# Patient Record
Sex: Female | Born: 1985 | Race: White | Hispanic: No | Marital: Married | Smoking: Never smoker
Health system: Southern US, Community
[De-identification: ages and names within clinical notes are randomized; demographics above are authoritative.]

## PROBLEM LIST (undated history)

## (undated) DIAGNOSIS — D649 Anemia, unspecified: Secondary | ICD-10-CM

## (undated) DIAGNOSIS — K589 Irritable bowel syndrome without diarrhea: Secondary | ICD-10-CM

## (undated) DIAGNOSIS — T8859XA Other complications of anesthesia, initial encounter: Secondary | ICD-10-CM

## (undated) DIAGNOSIS — E282 Polycystic ovarian syndrome: Secondary | ICD-10-CM

## (undated) DIAGNOSIS — F329 Major depressive disorder, single episode, unspecified: Secondary | ICD-10-CM

## (undated) DIAGNOSIS — T4145XA Adverse effect of unspecified anesthetic, initial encounter: Secondary | ICD-10-CM

## (undated) DIAGNOSIS — F32A Depression, unspecified: Secondary | ICD-10-CM

## (undated) DIAGNOSIS — E079 Disorder of thyroid, unspecified: Secondary | ICD-10-CM

## (undated) DIAGNOSIS — M797 Fibromyalgia: Secondary | ICD-10-CM

## (undated) DIAGNOSIS — R51 Headache: Secondary | ICD-10-CM

## (undated) HISTORY — DX: Disorder of thyroid, unspecified: E07.9

## (undated) HISTORY — PX: NO PAST SURGERIES: SHX2092

## (undated) HISTORY — PX: WISDOM TOOTH EXTRACTION: SHX21

---
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
## 2004-01-13 ENCOUNTER — Inpatient Hospital Stay (HOSPITAL_COMMUNITY): Admission: AD | Admit: 2004-01-13 | Discharge: 2004-01-13 | Payer: Self-pay | Admitting: Obstetrics and Gynecology

## 2004-03-05 ENCOUNTER — Encounter (INDEPENDENT_AMBULATORY_CARE_PROVIDER_SITE_OTHER): Payer: Self-pay | Admitting: Specialist

## 2004-03-05 ENCOUNTER — Inpatient Hospital Stay (HOSPITAL_COMMUNITY): Admission: AD | Admit: 2004-03-05 | Discharge: 2004-03-07 | Payer: Self-pay | Admitting: Obstetrics and Gynecology

## 2004-04-16 ENCOUNTER — Other Ambulatory Visit: Admission: RE | Admit: 2004-04-16 | Discharge: 2004-04-16 | Payer: Self-pay | Admitting: Obstetrics and Gynecology

## 2005-04-29 ENCOUNTER — Other Ambulatory Visit: Admission: RE | Admit: 2005-04-29 | Discharge: 2005-04-29 | Payer: Self-pay | Admitting: Obstetrics and Gynecology

## 2005-07-30 ENCOUNTER — Other Ambulatory Visit: Admission: RE | Admit: 2005-07-30 | Discharge: 2005-07-30 | Payer: Self-pay | Admitting: Obstetrics and Gynecology

## 2005-11-06 ENCOUNTER — Other Ambulatory Visit: Admission: RE | Admit: 2005-11-06 | Discharge: 2005-11-06 | Payer: Self-pay | Admitting: Obstetrics and Gynecology

## 2006-05-25 ENCOUNTER — Other Ambulatory Visit: Admission: RE | Admit: 2006-05-25 | Discharge: 2006-05-25 | Payer: Self-pay | Admitting: Obstetrics and Gynecology

## 2007-05-31 ENCOUNTER — Other Ambulatory Visit: Admission: RE | Admit: 2007-05-31 | Discharge: 2007-05-31 | Payer: Self-pay | Admitting: Obstetrics and Gynecology

## 2009-02-28 ENCOUNTER — Other Ambulatory Visit: Admission: RE | Admit: 2009-02-28 | Discharge: 2009-02-28 | Payer: Self-pay | Admitting: Family Medicine

## 2010-12-13 NOTE — Discharge Summary (Signed)
NAMEUNDRA, Brittany Cunningham              ACCOUNT NO.:  [id number]   MEDICAL RECORD NO.:  [id number]          PATIENT TYPE:  INP   LOCATION:  9103                          FACILITY:  [facility]   PHYSICIAN:  James A. Ashley Royalty, M.D.DATE OF BIRTH:  01-23-1986   DATE OF ADMISSION:  03/05/2004  DATE OF DISCHARGE:  03/07/2004                                 DISCHARGE SUMMARY   DISCHARGE DIAGNOSES:  1.  Intrauterine pregnancy at term, delivered.  2.  Term birth living child vertex.  3.  Condylomata acuminata--vulva.   CONSULTATIONS:  None.   DISCHARGE MEDICATIONS:  Percocet, Motrin, Chromagen.   HISTORY:  This is an 25 year old primigravida [redacted] weeks gestation.  The  patient was admitted with spontaneous rupture of membranes per Dr.  Sydnee Cabal. She was also having irregular contractions. For the remainder of  the past medical history, please see chart. Initial cervical examination  revealed 2 cm dilatation.  For the remainder of the history and physical  please see chart.   HOSPITAL COURSE:  The patient was admitted to [facility]  [place]. Admission laboratory studies were drawn. Intrauterine pressure  catheter was placed. The patient went on to labor and deliver on March 05, 2004. The infant was a 7 pound 4 ounce female, Apgar's 8 at 1 minute, 9 at 5  minutes and sent to the newborn nursery. At the time of delivery, several  white __________ were noted on the patient's perineum. A punch biopsy was  taken for a representative sample and submitted to pathology for histologic  studies. The pathology returned showing condylomata acuminata.  The  patient's postpartum course was complicated by modest anemia which was  asymptomatic.  Hemoglobin the following day was up slightly and she was felt  to be a candidate for discharge. Hence, she was discharged home March 07, 2004 afebrile and in satisfactory condition.   ACCESSORY CLINICAL FINDINGS:  Hemoglobin and hematocrit on admission were  10.4 and 31.4 respectively.  Repeat hemoglobin March 06, 2004 was 7.6.  Repeat hemoglobin March 07, 2004 was 7.8.   DISPOSITION:  The patient is to return to [facility]  in 4-6 weeks for postpartum evaluation.      JAM/MEDQ  D:  04/24/2004  T:  04/25/2004  Job:  [id number]

## 2011-10-15 ENCOUNTER — Emergency Department
Admission: EM | Admit: 2011-10-15 | Discharge: 2011-10-15 | Disposition: A | Discharge status: Home Health | Payer: BC Managed Care – PPO | Source: Home / Self Care | Attending: Emergency Medicine | Admitting: Emergency Medicine

## 2011-10-15 DIAGNOSIS — J039 Acute tonsillitis, unspecified: Secondary | ICD-10-CM

## 2011-10-15 LAB — POCT RAPID STREP A (OFFICE): Rapid Strep A Screen: NEGATIVE

## 2011-10-15 MED ORDER — CLARITHROMYCIN 500 MG PO TABS: 500.0000 mg | ORAL_TABLET | Freq: Two times a day (BID) | ORAL | Status: AC

## 2011-10-15 NOTE — ED Provider Notes (Signed)
History     CSN: [id number]  Arrival date & time 10/15/11  1911   First MD Initiated Contact with Patient 10/15/11 1923      Chief Complaint  Patient presents with  . Sore Throat    (Consider location/radiation/quality/duration/timing/severity/associated sxs/prior treatment) HPI Brittany Cunningham is a 26 y.o. female who complains of onset of cold symptoms for a few days. Not using any meds.  Moderate.  Constant. ++ sore throat No cough No pleuritic pain No wheezing No nasal congestion No post-nasal drainage No sinus pain/pressure No chest congestion No itchy/red eyes No earache No hemoptysis No SOB No chills/sweats No fever No nausea No vomiting No abdominal pain No diarrhea No skin rashes No fatigue No myalgias No headache    History reviewed. No pertinent past medical history.  History reviewed. No pertinent past surgical history.  History reviewed. No pertinent family history.  History  Substance Use Topics  . Smoking status: Not on file  . Smokeless tobacco: Not on file  . Alcohol Use: No    OB History    Grav Para Term Preterm Abortions TAB SAB Ect Mult Living                  Review of Systems  All other systems reviewed and are negative.    Allergies  Review of patient's allergies indicates no known allergies.  Home Medications   Current Outpatient Rx  Name Route Sig Dispense Refill  . CLARITHROMYCIN 500 MG PO TABS Oral Take 1 tablet (500 mg total) by mouth 2 (two) times daily. 16 tablet 0    BP 132/82  Pulse 101  Temp(Src) 98.2 F (36.8 C) (Oral)  Resp 16  Ht 5\' 3"  (1.6 m)  Wt 172 lb 4 oz (78.132 kg)  BMI 30.51 kg/m2  SpO2 98%  Physical Exam  Nursing note and vitals reviewed. Constitutional: She is oriented to person, place, and time. She appears well-developed and well-nourished.  HENT:  Head: Normocephalic and atraumatic.  Right Ear: Tympanic membrane, external ear and ear canal normal.  Left Ear: Tympanic membrane, external  ear and ear canal normal.  Nose: Nose normal.  Mouth/Throat: Oropharyngeal exudate, posterior oropharyngeal edema (tonsillar enlargement bilateral 1+) and posterior oropharyngeal erythema present. No tonsillar abscesses.  Eyes: No scleral icterus.  Neck: Neck supple.  Cardiovascular: Regular rhythm and normal heart sounds.   Pulmonary/Chest: Effort normal and breath sounds normal. No respiratory distress.  Neurological: She is alert and oriented to person, place, and time.  Skin: Skin is warm and dry.  Psychiatric: She has a normal mood and affect. Her speech is normal.    ED Course  Procedures (including critical care time)  Labs Reviewed - No data to display No results found.   1. Tonsillitis       MDM  1)  Take the prescribed antibiotic as instructed. 2)  Use nasal saline solution (over the counter) at least 3 times a day. 3)  Use over the counter decongestants like Zyrtec-D every 12 hours as needed to help with congestion.  If you have hypertension, do not take medicines with sudafed.  4)  Can take tylenol every 6 hours or motrin every 8 hours for pain or fever. 5)  Follow up with your primary doctor if no improvement in 5-7 days, sooner if increasing pain, fever, or new symptoms.     Marlaine Hind, MD 10/15/11 [phone]

## 2011-10-15 NOTE — ED Notes (Signed)
Pt c/o sore throat onset Sat.  Pt states she has white patches on L side.  Pt states she has felt febrile at times.

## 2011-10-21 ENCOUNTER — Telehealth: Payer: Self-pay | Admitting: *Deleted

## 2011-11-22 ENCOUNTER — Emergency Department
Admission: EM | Admit: 2011-11-22 | Discharge: 2011-11-22 | Disposition: A | Discharge status: Home / Self Care | Payer: BC Managed Care – PPO | Source: Home / Self Care | Attending: Emergency Medicine | Admitting: Emergency Medicine

## 2011-11-22 DIAGNOSIS — J029 Acute pharyngitis, unspecified: Secondary | ICD-10-CM

## 2011-11-22 DIAGNOSIS — J039 Acute tonsillitis, unspecified: Secondary | ICD-10-CM

## 2011-11-22 LAB — POCT URINALYSIS DIP (MANUAL ENTRY)
Bilirubin, UA: NEGATIVE
Blood, UA: NEGATIVE
Glucose, UA: NEGATIVE
Ketones, POC UA: NEGATIVE
Leukocytes, UA: NEGATIVE
Nitrite, UA: POSITIVE
Protein Ur, POC: NEGATIVE
Spec Grav, UA: 1.015 (ref 1.005–1.03)
Urobilinogen, UA: 0.2 (ref 0–1)
pH, UA: 5.5 (ref 5–8)

## 2011-11-22 MED ORDER — CLARITHROMYCIN 500 MG PO TABS: 500.0000 mg | ORAL_TABLET | Freq: Two times a day (BID) | ORAL | Status: AC

## 2011-11-22 NOTE — ED Notes (Signed)
Erroneous U/A ordered and recorded from different patient; erroneous urine culture ordered from different patient.

## 2011-11-22 NOTE — ED Provider Notes (Addendum)
History     CSN: [id number]  Arrival date & time 11/22/11  1710   First MD Initiated Contact with Patient 10/15/11 1923      No chief complaint on file.   (Consider location/radiation/quality/duration/timing/severity/associated sxs/prior treatment) HPI  Brittany Cunningham is a 26 y.o. female who complains of onset of sore throat for 1-2 days. Not using any meds.  Moderate.  Constant.  Had tonsillitis 1 month ago. ++ sore throat No cough No pleuritic pain No wheezing No nasal congestion No post-nasal drainage No sinus pain/pressure No chest congestion No itchy/red eyes No earache No hemoptysis No SOB No chills/sweats No fever No nausea No vomiting No abdominal pain No diarrhea No skin rashes No fatigue No myalgias No headache    No past medical history on file.  No past surgical history on file.  No family history on file.  History  Substance Use Topics  . Smoking status: Not on file  . Smokeless tobacco: Not on file  . Alcohol Use: No    OB History    Grav Para Term Preterm Abortions TAB SAB Ect Mult Living                  Review of Systems  All other systems reviewed and are negative.    Allergies  Review of patient's allergies indicates no known allergies.  Home Medications   Current Outpatient Rx  Name Route Sig Dispense Refill  . CLARITHROMYCIN 500 MG PO TABS Oral Take 1 tablet (500 mg total) by mouth 2 (two) times daily. 18 tablet 0    There were no vitals taken for this visit.  Physical Exam  Nursing note and vitals reviewed. Constitutional: She is oriented to person, place, and time. She appears well-developed and well-nourished.  HENT:  Head: Normocephalic and atraumatic.  Right Ear: Tympanic membrane, external ear and ear canal normal.  Left Ear: Tympanic membrane, external ear and ear canal normal.  Nose: Nose normal.  Mouth/Throat: Oropharyngeal exudate, posterior oropharyngeal edema (tonsillar enlargement bilateral 1+) and posterior  oropharyngeal erythema present. No tonsillar abscesses.  Eyes: No scleral icterus.  Neck: Neck supple.  Cardiovascular: Regular rhythm and normal heart sounds.   Pulmonary/Chest: Effort normal and breath sounds normal. No respiratory distress.  Neurological: She is alert and oriented to person, place, and time.  Skin: Skin is warm and dry.  Psychiatric: She has a normal mood and affect. Her speech is normal.    ED Course  Procedures  (including critical care time)   Labs Reviewed  POCT URINALYSIS DIP (MANUAL ENTRY)  URINE CULTURE   No results found.   1. Acute pharyngitis   2. Tonsillitis       MDM  1)  Take the prescribed antibiotic as instructed.  The rapid strep test is negative.  A throat culture is pending.  However according to her physical examination I feel that antibiotic therapy is appropriate. 2)  Use nasal saline solution (over the counter) at least 3 times a day. 3)  Can take tylenol every 6 hours or motrin every 8 hours for pain or fever. 4)  Follow up with your primary doctor if no improvement in 5-7 days, sooner if increasing pain, fever, or new symptoms.   Followup with ENT if this becomes a recurrent issue.  Make sure she changes her toothbrush and washes anything that may have touched her mouth recently.  Marlaine Hind, MD 11/22/11 1758  Marlaine Hind, MD 11/23/11 [phone]

## 2011-11-23 ENCOUNTER — Encounter: Payer: Self-pay | Admitting: Emergency Medicine

## 2011-11-23 LAB — STREP A DNA PROBE: GASP: NEGATIVE

## 2012-06-02 ENCOUNTER — Emergency Department
Admission: EM | Admit: 2012-06-02 | Discharge: 2012-06-02 | Disposition: A | Discharge status: Home / Self Care | Payer: Self-pay | Source: Home / Self Care

## 2012-06-02 ENCOUNTER — Encounter: Payer: Self-pay | Admitting: *Deleted

## 2012-06-02 DIAGNOSIS — R197 Diarrhea, unspecified: Secondary | ICD-10-CM

## 2012-06-02 DIAGNOSIS — F4310 Post-traumatic stress disorder, unspecified: Secondary | ICD-10-CM

## 2012-06-02 DIAGNOSIS — R109 Unspecified abdominal pain: Secondary | ICD-10-CM

## 2012-06-02 DIAGNOSIS — K5900 Constipation, unspecified: Secondary | ICD-10-CM

## 2012-06-02 DIAGNOSIS — K589 Irritable bowel syndrome without diarrhea: Secondary | ICD-10-CM

## 2012-06-02 DIAGNOSIS — F419 Anxiety disorder, unspecified: Secondary | ICD-10-CM

## 2012-06-02 NOTE — ED Notes (Signed)
Patient c/o episodes of diarrhea and constipation on and off for 1-2 months. States she feels without energy, tiered, and weak. Has tried Pepto and Tylenol.
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
## 2012-06-07 ENCOUNTER — Ambulatory Visit (INDEPENDENT_AMBULATORY_CARE_PROVIDER_SITE_OTHER): Payer: BC Managed Care – PPO | Admitting: Sports Medicine

## 2012-06-07 ENCOUNTER — Encounter: Payer: Self-pay | Admitting: Sports Medicine

## 2012-06-07 VITALS — BP 133/84 | HR 64 | Temp 98.2°F | Resp 12 | Ht 63.0 in | Wt 190.0 lb

## 2012-06-07 DIAGNOSIS — F39 Unspecified mood [affective] disorder: Secondary | ICD-10-CM | POA: Insufficient documentation

## 2012-06-07 DIAGNOSIS — Z299 Encounter for prophylactic measures, unspecified: Secondary | ICD-10-CM | POA: Insufficient documentation

## 2012-06-07 DIAGNOSIS — R519 Headache, unspecified: Secondary | ICD-10-CM | POA: Insufficient documentation

## 2012-06-07 DIAGNOSIS — R51 Headache: Secondary | ICD-10-CM | POA: Insufficient documentation

## 2012-06-07 MED ORDER — MELOXICAM 15 MG PO TABS: ORAL_TABLET | ORAL | Status: DC

## 2012-06-07 MED ORDER — CITALOPRAM HYDROBROMIDE 20 MG PO TABS: 20.0000 mg | ORAL_TABLET | Freq: Every day | ORAL | Status: DC

## 2012-06-07 NOTE — Assessment & Plan Note (Signed)
Mobic. Sounds like tension rather than migraine type headaches
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
## 2012-06-07 NOTE — Assessment & Plan Note (Addendum)
PTSD/depression with anxious features. Citalopram 20. I would certainly like to have her talk to psychiatry downstairs, however unfortunately her insurance does not pay for any further visits, and her co-pay will be too high. She declines my offer to set this up. Return to clinic in 2 weeks to reassess.

## 2012-06-07 NOTE — Assessment & Plan Note (Signed)
Lipids, TSH, T3, T4, CBC, CMET. Return to clinic for cervical cancer screening

## 2012-06-21 ENCOUNTER — Ambulatory Visit: Payer: BC Managed Care – PPO | Admitting: Sports Medicine

## 2012-06-21 ENCOUNTER — Telehealth: Payer: Self-pay | Admitting: *Deleted

## 2012-06-21 DIAGNOSIS — Z0289 Encounter for other administrative examinations: Secondary | ICD-10-CM

## 2012-06-21 NOTE — Telephone Encounter (Signed)
Tried to contact pt and had to [facility] in regards pt missed her appt this am to f/u. LM for pt to call back to reschedule and to let us know how she is doing.

## 2012-09-04 ENCOUNTER — Encounter: Payer: Self-pay | Admitting: *Deleted

## 2012-09-04 ENCOUNTER — Emergency Department (INDEPENDENT_AMBULATORY_CARE_PROVIDER_SITE_OTHER)
Admission: EM | Admit: 2012-09-04 | Discharge: 2012-09-04 | Disposition: A | Discharge status: Home / Self Care | Payer: BC Managed Care – PPO | Source: Home / Self Care | Attending: Family Medicine | Admitting: Family Medicine

## 2012-09-04 DIAGNOSIS — R509 Fever, unspecified: Secondary | ICD-10-CM

## 2012-09-04 DIAGNOSIS — J111 Influenza due to unidentified influenza virus with other respiratory manifestations: Secondary | ICD-10-CM

## 2012-09-04 DIAGNOSIS — R05 Cough: Secondary | ICD-10-CM

## 2012-09-04 DIAGNOSIS — R059 Cough, unspecified: Secondary | ICD-10-CM

## 2012-09-04 LAB — POCT INFLUENZA A/B
Influenza A, POC: NEGATIVE
Influenza B, POC: NEGATIVE

## 2012-09-04 LAB — POCT RAPID STREP A (OFFICE): Rapid Strep A Screen: NEGATIVE

## 2012-09-04 MED ORDER — OSELTAMIVIR PHOSPHATE 75 MG PO CAPS: 75.0000 mg | ORAL_CAPSULE | Freq: Two times a day (BID) | ORAL | Status: DC

## 2012-09-04 NOTE — ED Notes (Signed)
Pt c/o chills, fever, headache since yesterday. Has tried OTC Tylenol, Ibuprofen.

## 2012-09-04 NOTE — ED Provider Notes (Signed)
History     CSN: [id number]  Arrival date & time 09/04/12  1737   First MD Initiated Contact with Patient 09/04/12 1756      Chief Complaint  Patient presents with  . Fever  . Chills  . Headache       HPI Comments: Patient complains of rather sudden onset of flu-like symptoms yesterday with chills, fever, headache, mild sore throat, fatigue, and mild non-productive cough.  The history is provided by the patient.    Past Medical History  Diagnosis Date  . Thyroid disease     currently not treated with meds    History reviewed. No pertinent past surgical history.  Family History  Problem Relation Age of Onset  . Thyroid disease Mother   . Hypertension Father   . Diabetes Maternal Grandmother     History  Substance Use Topics  . Smoking status: Never Smoker   . Smokeless tobacco: Not on file  . Alcohol Use: No    OB History   Grav Para Term Preterm Abortions TAB SAB Ect Mult Living                  Review of Systems + sore throat + cough No pleuritic pain No wheezing No nasal congestion ? post-nasal drainage No sinus pain/pressure No itchy/red eyes No earache No hemoptysis No SOB + fever, + chills No nausea No vomiting No abdominal pain No diarrhea No urinary symptoms No skin rashes + fatigue + myalgias + headache Used OTC meds without relief  Allergies  Review of patient's allergies indicates no known allergies.  Home Medications   Current Outpatient Rx  Name  Route  Sig  Dispense  Refill  . citalopram (CELEXA) 20 MG tablet   Oral   Take 1 tablet (20 mg total) by mouth daily.   90 tablet   0   . JUNEL 1/20 1-20 MG-MCG tablet               . meloxicam (MOBIC) 15 MG tablet      One tab PO qAM with breakfast for 2 weeks, then daily prn pain.   30 tablet   3   . oseltamivir (TAMIFLU) 75 MG capsule   Oral   Take 1 capsule (75 mg total) by mouth every 12 (twelve) hours.   10 capsule   0     BP 143/92  Pulse 119   Temp(Src) 99 F (37.2 C) (Oral)  Resp 16  Ht 5\' 3"  (1.6 m)  Wt 198 lb 8 oz (90.039 kg)  BMI 35.17 kg/m2  SpO2 98%  Physical Exam Nursing notes and Vital Signs reviewed. Appearance:  Patient appears stated age, and in no acute distress.  Patient is obese (BMI 35.2) Eyes:  Pupils are equal, round, and reactive to light and accomodation.  Extraocular movement is intact.  Conjunctivae are not inflamed  Ears:  Canals normal.  Tympanic membranes normal.  Nose:  Mildly congested turbinates.  No sinus tenderness.   Pharynx:  Normal Neck:  Supple.  Slightly tender shotty posterior nodes are palpated bilaterally  Lungs:  Clear to auscultation.  Breath sounds are equal.  Heart:  Regular rate and rhythm without murmurs, rubs, or gallops.  Abdomen:  Nontender without masses or hepatosplenomegaly.  Bowel sounds are present.  No CVA or flank tenderness.  Extremities:  No edema.  No calf tenderness Skin:  No rash present.   ED Course  Procedures  none  Labs Reviewed  POCT INFLUENZA  A/B negative  POCT RAPID STREP A (OFFICE) negative      1. Influenza-like illness       MDM  Despite negative flu test, will begin Tamiflu. Take Mucinex D (guaifenesin with decongestant) twice daily for congestion.  Increase fluid intake, rest. May use Afrin nasal spray (or generic oxymetazoline) twice daily for about 5 days.  Also recommend using saline nasal spray several times daily and saline nasal irrigation (AYR is a common brand) Stop all antihistamines for now, and other non-prescription cough/cold preparations. May take Ibuprofen 200mg , 4 tabs every 8 hours with food for fever, sore throat, body aches, etc. May take Delsym Cough Suppressant at bedtime for nighttime cough.  Follow-up with family doctor if not improving about one week.        Lattie Haw, MD 09/09/12 [phone]

## 2012-11-03 LAB — OB RESULTS CONSOLE HIV ANTIBODY (ROUTINE TESTING): HIV: NONREACTIVE

## 2012-11-03 LAB — OB RESULTS CONSOLE GC/CHLAMYDIA
Chlamydia: NEGATIVE
Gonorrhea: NEGATIVE

## 2012-11-03 LAB — OB RESULTS CONSOLE RUBELLA ANTIBODY, IGM: Rubella: IMMUNE

## 2012-11-03 LAB — OB RESULTS CONSOLE RPR: RPR: NONREACTIVE

## 2012-11-03 LAB — OB RESULTS CONSOLE HEPATITIS B SURFACE ANTIGEN: Hepatitis B Surface Ag: NEGATIVE

## 2012-12-23 ENCOUNTER — Other Ambulatory Visit (HOSPITAL_COMMUNITY): Payer: Self-pay | Admitting: Obstetrics and Gynecology

## 2012-12-23 DIAGNOSIS — Z3689 Encounter for other specified antenatal screening: Secondary | ICD-10-CM

## 2012-12-23 DIAGNOSIS — IMO0001 Reserved for inherently not codable concepts without codable children: Secondary | ICD-10-CM

## 2012-12-28 ENCOUNTER — Ambulatory Visit (HOSPITAL_COMMUNITY): Payer: BC Managed Care – PPO

## 2013-02-17 ENCOUNTER — Other Ambulatory Visit: Payer: Self-pay

## 2013-02-22 ENCOUNTER — Other Ambulatory Visit (HOSPITAL_COMMUNITY): Payer: Self-pay | Admitting: Obstetrics and Gynecology

## 2013-02-22 DIAGNOSIS — O30009 Twin pregnancy, unspecified number of placenta and unspecified number of amniotic sacs, unspecified trimester: Secondary | ICD-10-CM

## 2013-02-25 ENCOUNTER — Ambulatory Visit (HOSPITAL_COMMUNITY)
Admission: RE | Admit: 2013-02-25 | Discharge: 2013-02-25 | Disposition: A | Discharge status: Home / Self Care | Payer: BC Managed Care – PPO | Source: Ambulatory Visit | Attending: Obstetrics and Gynecology | Admitting: Obstetrics and Gynecology

## 2013-02-25 ENCOUNTER — Encounter (HOSPITAL_COMMUNITY): Payer: Self-pay

## 2013-02-25 ENCOUNTER — Other Ambulatory Visit (HOSPITAL_COMMUNITY): Payer: Self-pay | Admitting: Obstetrics and Gynecology

## 2013-02-25 VITALS — BP 141/79 | HR 110 | Wt 222.5 lb

## 2013-02-25 DIAGNOSIS — O365922 Maternal care for other known or suspected poor fetal growth, second trimester, fetus 2: Secondary | ICD-10-CM

## 2013-02-25 DIAGNOSIS — O30009 Twin pregnancy, unspecified number of placenta and unspecified number of amniotic sacs, unspecified trimester: Secondary | ICD-10-CM | POA: Insufficient documentation

## 2013-02-25 DIAGNOSIS — Z1389 Encounter for screening for other disorder: Secondary | ICD-10-CM | POA: Insufficient documentation

## 2013-02-25 DIAGNOSIS — Z8751 Personal history of pre-term labor: Secondary | ICD-10-CM | POA: Insufficient documentation

## 2013-02-25 DIAGNOSIS — O358XX0 Maternal care for other (suspected) fetal abnormality and damage, not applicable or unspecified: Secondary | ICD-10-CM | POA: Insufficient documentation

## 2013-02-25 DIAGNOSIS — Z363 Encounter for antenatal screening for malformations: Secondary | ICD-10-CM | POA: Insufficient documentation

## 2013-02-25 IMAGING — US US OB DETAIL+14 WK
1 series · 13 of 28 positions shown · non-contrast
Comparison: none

[Series 1: us ob detail+14 wk · 0.26mm/px · 13 of 130 slices shown]
[im 5/130]
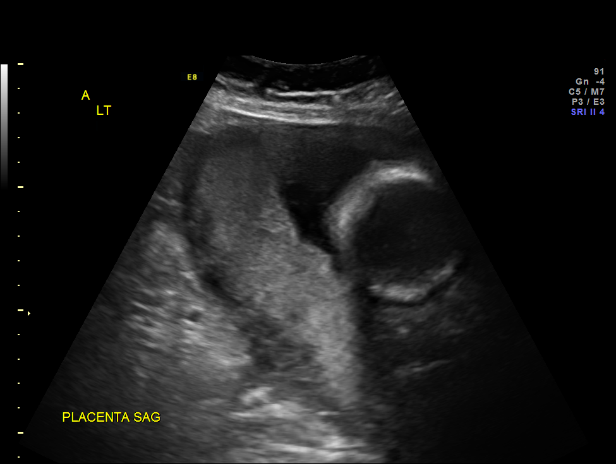
[im 15/130]
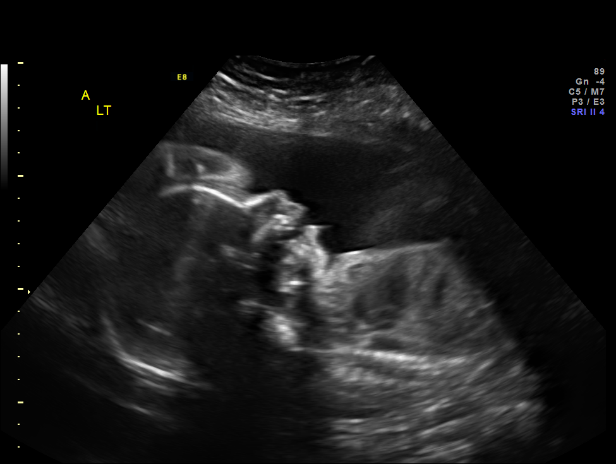
[im 24/130]
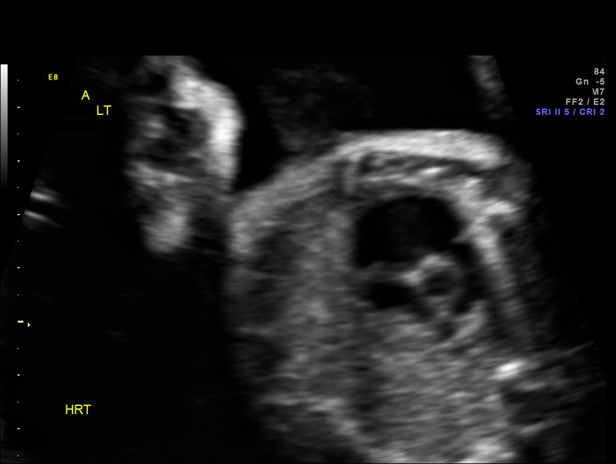
[im 34/130]
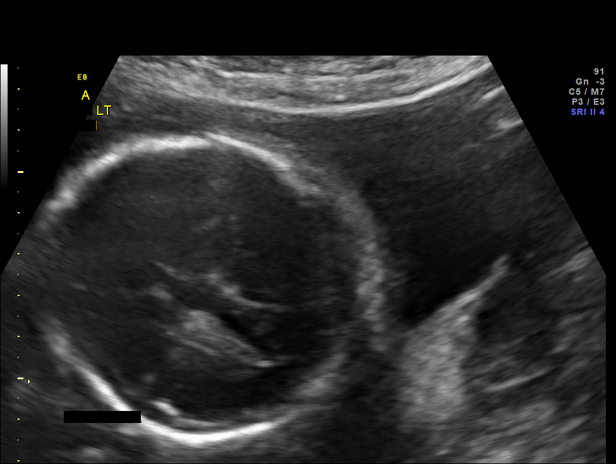
[im 44/130]
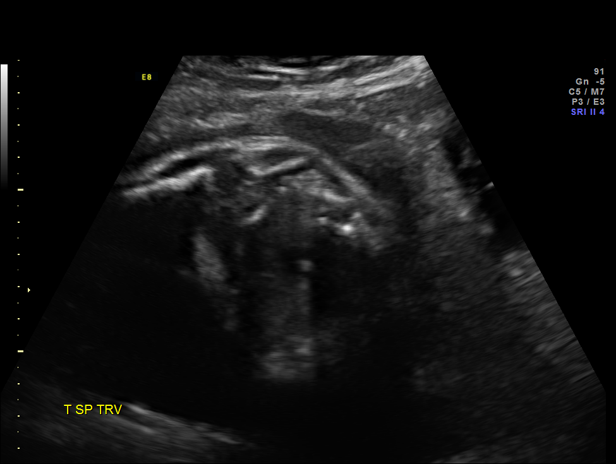
[im 53/130]
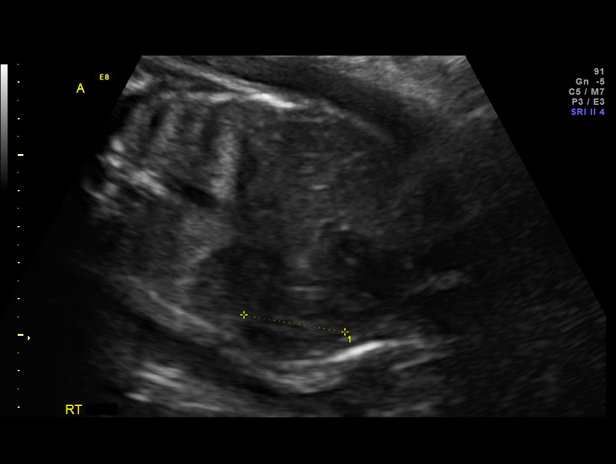
[im 67/130]
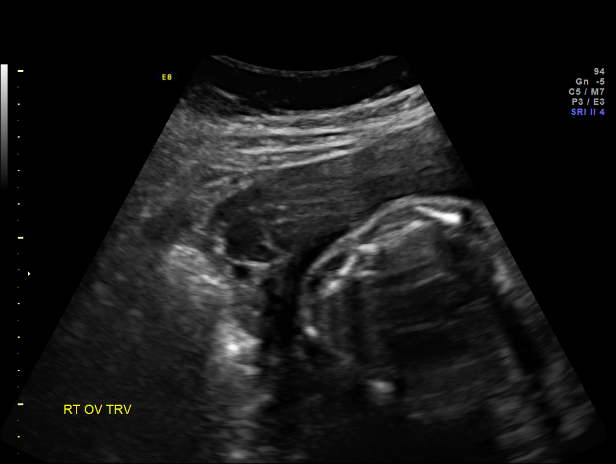
[im 77/130]
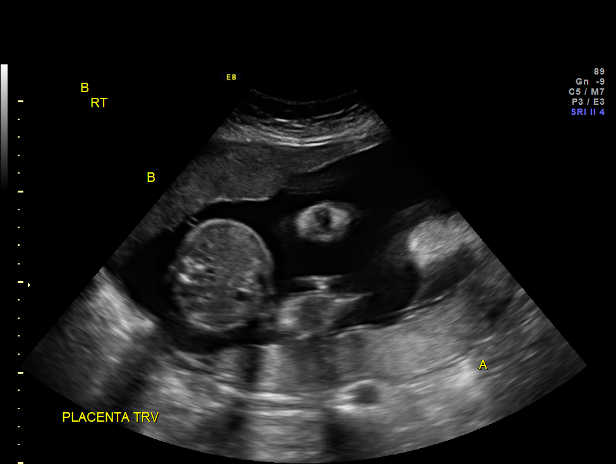
[im 87/130]
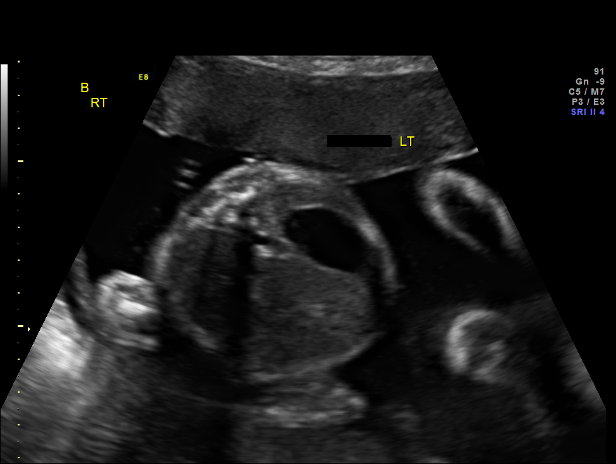
[im 96/130]
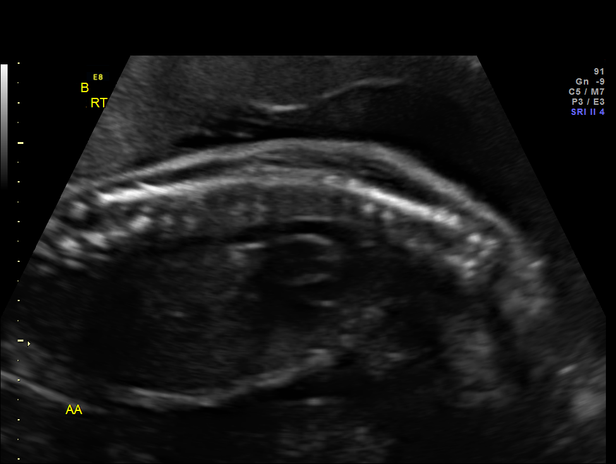
[im 106/130]
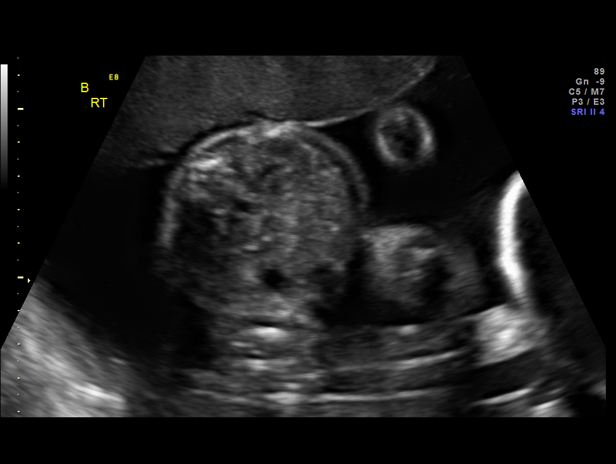
[im 115/130]
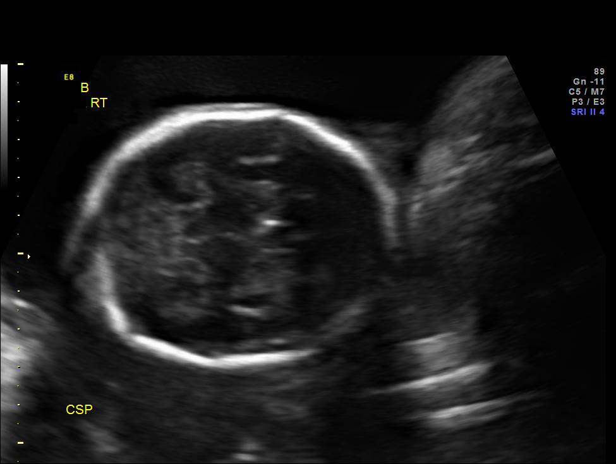
[im 125/130]
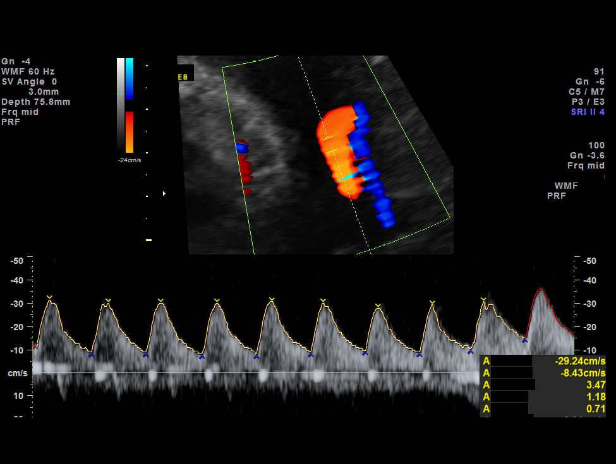

[13 of 28 positions shown; findings below may reference images not displayed]

OBSTETRICS REPORT
                      (Signed Final 02/25/2013 [DATE])

Service(s) Provided

 US OB DETAIL + 14 WK                                  76811.0
 US OB DETAIL ADDL GEST + 14 WK                        76811.1
 US UA CORD DOPPLER                                    76820.0
 US UA ADDL GEST                                       76820.1
Indications

 Detailed fetal anatomic survey
 Di-Di Twin pregnancy, twins discordant, antepartum
 Poor obstetric history: Previous preterm delivery
 (36 weeks)
Fetal Evaluation (Fetus A)

 Num Of Fetuses:    2
 Fetal Heart Rate:  167                          bpm
 Cardiac Activity:  Observed
 Fetal Lie:         Lower left Fetus
 Presentation:      Breech
 Placenta:          Posterior, above cervical
                    os
 P. Cord            Visualized, central
 Insertion:

 Membrane Desc:     Dividing
                    Membrane seen
                    - Dichorionic.

 Amniotic Fluid
 AFI FV:      Subjectively within normal limits
                                             Larg Pckt:     5.7  cm
Biometry (Fetus A)

 BPD:     68.2  mm     G. Age:  27w 3d                CI:         76.2   70 - 86
 OFD:     89.5  mm                                    FL/HC:      20.4   18.6 -

 HC:     252.6  mm     G. Age:  27w 3d       31  %    HC/AC:      1.07   1.05 -

 AC:     235.8  mm     G. Age:  27w 6d       65  %    FL/BPD:     75.7   71 - 87
 FL:      51.6  mm     G. Age:  27w 4d       48  %    FL/AC:      21.9   20 - 24
 HUM:     45.9  mm     G. Age:  27w 0d       46  %
 CER:     30.6  mm     G. Age:  26w 6d       44  %

 Est. FW:    5555  gm      2 lb 7 oz     65  %     FW Discordancy     0 \ 22 %
Gestational Age (Fetus A)

 LMP:           27w 1d        Date:  08/19/12                 EDD:   05/26/13
 U/S Today:     27w 4d                                        EDD:   05/23/13
 Best:          27w 1d     Det. By:  LMP  (08/19/12)          EDD:   05/26/13
Anatomy (Fetus A)

 Cranium:          Appears normal         Aortic Arch:      Not well visualized
 Fetal Cavum:      Appears normal         Ductal Arch:      Appears normal
 Ventricles:       Appears normal         Diaphragm:        Appears normal
 Choroid Plexus:   Appears normal         Stomach:          Appears normal, left
                                                            sided
 Cerebellum:       Appears normal         Abdomen:          Appears normal
 Posterior Fossa:  Appears normal         Abdominal Wall:   Not well visualized
 Nuchal Fold:      Not applicable (>20    Cord Vessels:     Appears normal (3
                   wks GA)                                  vessel cord)
 Face:             Appears normal         Kidneys:          Appear normal
                   (orbits and profile)
 Lips:             Appears normal         Bladder:          Appears normal
 Heart:            Appears normal         Spine:            Limited views
                   (4CH, axis, and                          appear normal
                   situs)
 RVOT:             Appears normal         Lower             Visualized
                                          Extremities:
 LVOT:             Appears normal         Upper             Visualized
                                          Extremities:

 Other:  Fetus appears to be a male. Technically difficult due to advanced GA
         and fetal position.
Targeted Anatomy (Fetus A)

 Fetal Central Nervous System
 Cisterna Magna:
Doppler - Fetal Vessels (Fetus A)

 Umbilical Artery
 S/D:   2.92           40  %tile       RI:
 PI:    1.03                           PSV:       38.57   cm/s
 Umbilical Artery
 Absent DFV:    No     Reverse DFV:    No

Fetal Evaluation (Fetus B)

 Num Of Fetuses:    2
 Fetal Heart Rate:  150                          bpm
 Cardiac Activity:  Observed
 Fetal Lie:         Upper right Fetus
 Presentation:      Cephalic
 Placenta:          Anterior right, above
                    cervical os
 P. Cord            Visualized, central
 Insertion:

 Membrane Desc:     Dividing
                    Membrane seen
                    - Dichorionic.
 Amniotic Fluid
 AFI FV:      Subjectively increased
                                             Larg Pckt:    11.3  cm
Biometry (Fetus B)

 BPD:     64.3  mm     G. Age:  26w 0d                CI:         76.5   70 - 86
 OFD:     84.1  mm                                    FL/HC:      21.3   18.6 -

 HC:     240.3  mm     G. Age:  26w 1d        5  %    HC/AC:      1.19   1.05 -

 AC:     202.5  mm     G. Age:  24w 6d      < 3  %    FL/BPD:     79.5   71 - 87
 FL:      51.1  mm     G. Age:  27w 2d       42  %    FL/AC:      25.2   20 - 24
 HUM:     45.5  mm     G. Age:  26w 6d       41  %
 CER:       30  mm     G. Age:  26w 4d       37  %

 Est. FW:     873  gm    1 lb 15 oz      27  %     FW Discordancy        22  %
Gestational Age (Fetus B)

 LMP:           27w 1d        Date:  08/19/12                 EDD:   05/26/13
 U/S Today:     26w 1d                                        EDD:   06/02/13
 Best:          27w 1d     Det. By:  LMP  (08/19/12)          EDD:   05/26/13
Anatomy (Fetus B)

 Cranium:          Appears normal         Aortic Arch:      Appears normal
 Fetal Cavum:      Appears normal         Ductal Arch:      Appears normal
 Ventricles:       Appears normal         Diaphragm:        Appears normal
 Choroid Plexus:   Appears normal         Stomach:          Appears normal, left
                                                            sided
 Cerebellum:       Appears normal         Abdomen:          Appears normal
 Posterior Fossa:  Appears normal         Abdominal Wall:   Appears nml (cord
                                                            insert, abd wall)
 Nuchal Fold:      Not applicable (>20    Cord Vessels:     Appears normal (3
                   wks GA)                                  vessel cord)
 Face:             Orbits appear          Kidneys:          Appear normal
                   normal
 Lips:             Not well visualized    Bladder:          Appears normal
 Heart:            Not well visualized    Spine:            Appears normal
 RVOT:             Not well visualized    Lower             Visualized
                                          Extremities:
 LVOT:             Not well visualized    Upper             Visualized
                                          Extremities:

 Other:  Fetus appears to be a female. Technically difficult due to advanced
         GA and fetal position.
Targeted Anatomy (Fetus B)

 Fetal Central Nervous System
 Cisterna Magna:
Doppler - Fetal Vessels (Fetus B)

 Umbilical Artery
 S/D:   2.77           32  %tile       RI:
 PI:    0.98                           PSV:       38.47   cm/s
 Umbilical Artery
 Absent DFV:    No     Reverse DFV:    No
Cervix Uterus Adnexa

 Cervical Length:    3        cm

 Cervix:       Normal appearance by transabdominal scan.
 Left Ovary:    Within normal limits.
 Right Ovary:   Within normal limits.

 Adnexa:     No abnormality visualized.
Impression

 DC/DA twin gestation
 A 22% inter-twin growth discordance is noted

 A: Breech, male, posterior placenta
     Normal anatomic fetal survey
     Fetal growth is appropriate (65th %tile)
     Normal amniotic fluid volume
     Normal UA Dopplers for Gestational age

 B: Cephalic, maternal right, female, anterior placenta
      Estimated fetal weight at the 27th %tile.  The AC
 measures < 3rd %tile (asymmetric growth)
      The fetal heart and face were not well visualized due to
 fetal position
      Mild polyhydramnios noted (max verticle pocket 11 cm)
      The fetal stomach appears somewhat promintent, but
 within normal limits.  There is no evidence of dilated bowel or
 obvious anatomic abnormalities to explain polyhydramnios
      Normal UA Dopplers for gestational age

 The findings and limitations of the study were discussed with
 the patient.  We briefly reviewed the differential diagnosis for
 polyhydramnios.
Recommendations

 Recommend weekly BPPs and UA Dopplers due to lagging
 AC in Twin B
 Follow up growth scan in 3 weeks

## 2013-02-25 NOTE — Progress Notes (Signed)
Brittany Cunningham  was seen today for an ultrasound appointment.  See full report in [facility].  Impression: DC/DA twin gestation A 22% inter-twin growth discordance is noted   A: Breech, female, posterior placenta     Normal anatomic fetal survey     Fetal growth is appropriate (65th %tile)     Normal amniotic fluid volume     Normal UA Dopplers for Gestational age  B: Cephalic, maternal right, female, anterior placenta      Estimated fetal weight at the 27th %tile.  [facility] measures < 3rd %tile (asymmetric growth)      The fetal heart and face were not well visualized due to fetal position      Mild polyhydramnios noted (max verticle pocket 11 cm)      The fetal stomach appears somewhat promintent, but within normal limits.  There is no evidence of dilated bowel or obvious anatomic abnormalities to explain polyhydramnios      Normal UA Dopplers for gestational age  The findings and limitations of the study were discussed with the patient.  We briefly reviewed the differential diagnosis for polyhydramnios.  Recommendations: Recommend weekly BPPs and UA Dopplers due to lagging [facility] in Twin B Follow up growth scan in 3 weeks  Alpha Gula, MD

## 2013-03-01 ENCOUNTER — Other Ambulatory Visit (HOSPITAL_COMMUNITY): Payer: Self-pay | Admitting: Maternal and Fetal Medicine

## 2013-03-01 DIAGNOSIS — O365922 Maternal care for other known or suspected poor fetal growth, second trimester, fetus 2: Secondary | ICD-10-CM

## 2013-03-01 DIAGNOSIS — O30009 Twin pregnancy, unspecified number of placenta and unspecified number of amniotic sacs, unspecified trimester: Secondary | ICD-10-CM

## 2013-03-02 ENCOUNTER — Ambulatory Visit (HOSPITAL_COMMUNITY)
Admission: RE | Admit: 2013-03-02 | Discharge: 2013-03-02 | Disposition: A | Discharge status: Home / Self Care | Payer: BC Managed Care – PPO | Source: Ambulatory Visit | Attending: Obstetrics and Gynecology | Admitting: Obstetrics and Gynecology

## 2013-03-02 ENCOUNTER — Encounter (HOSPITAL_COMMUNITY): Payer: Self-pay

## 2013-03-02 DIAGNOSIS — Z8751 Personal history of pre-term labor: Secondary | ICD-10-CM | POA: Insufficient documentation

## 2013-03-02 DIAGNOSIS — O365990 Maternal care for other known or suspected poor fetal growth, unspecified trimester, not applicable or unspecified: Secondary | ICD-10-CM | POA: Insufficient documentation

## 2013-03-02 DIAGNOSIS — O409XX0 Polyhydramnios, unspecified trimester, not applicable or unspecified: Secondary | ICD-10-CM | POA: Insufficient documentation

## 2013-03-02 DIAGNOSIS — O365922 Maternal care for other known or suspected poor fetal growth, second trimester, fetus 2: Secondary | ICD-10-CM

## 2013-03-02 DIAGNOSIS — O30009 Twin pregnancy, unspecified number of placenta and unspecified number of amniotic sacs, unspecified trimester: Secondary | ICD-10-CM | POA: Insufficient documentation

## 2013-03-02 IMAGING — US US FETAL BPP W/O NONSTRESS
1 series · 12 of 28 positions shown · non-contrast
Comparison: none

[Series 1: us fetal bpp w/o nonstress · 12 of 30 slices shown]
[im 2/30]
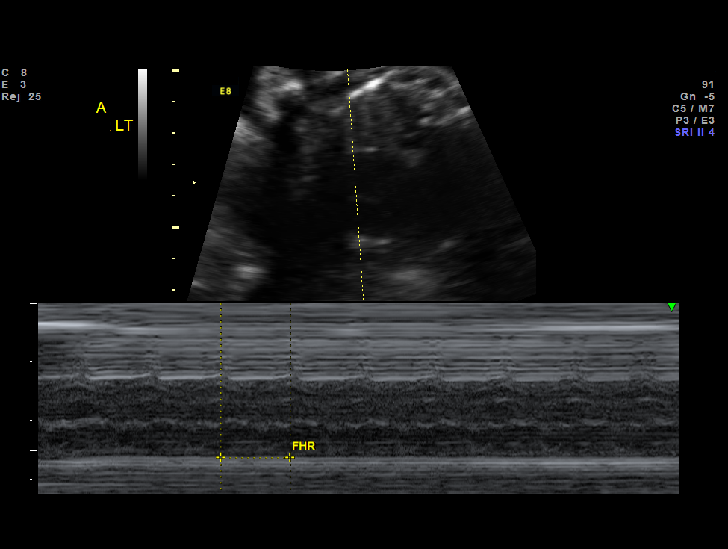
[im 4/30]
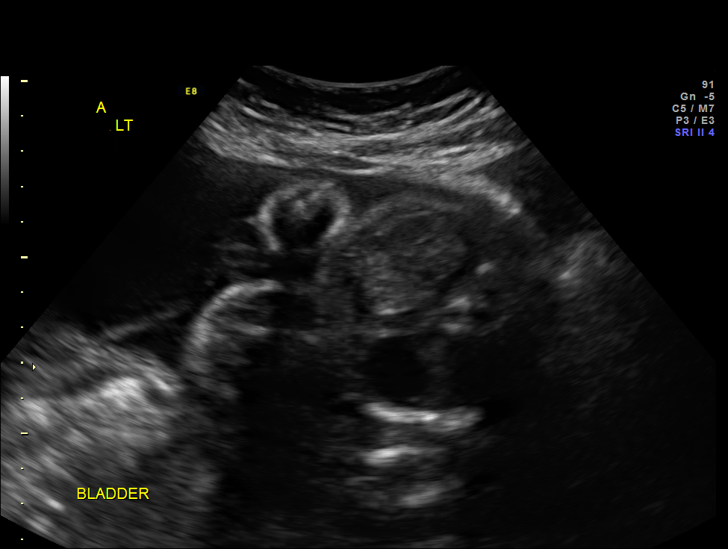
[im 6/30]
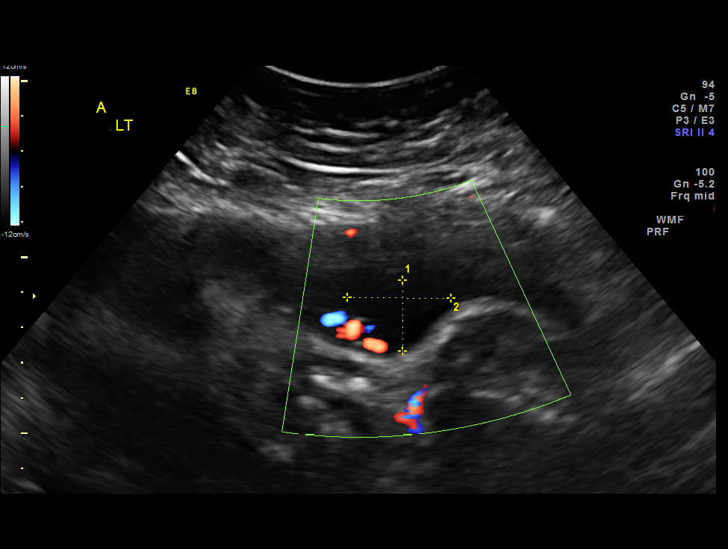
[im 9/30]
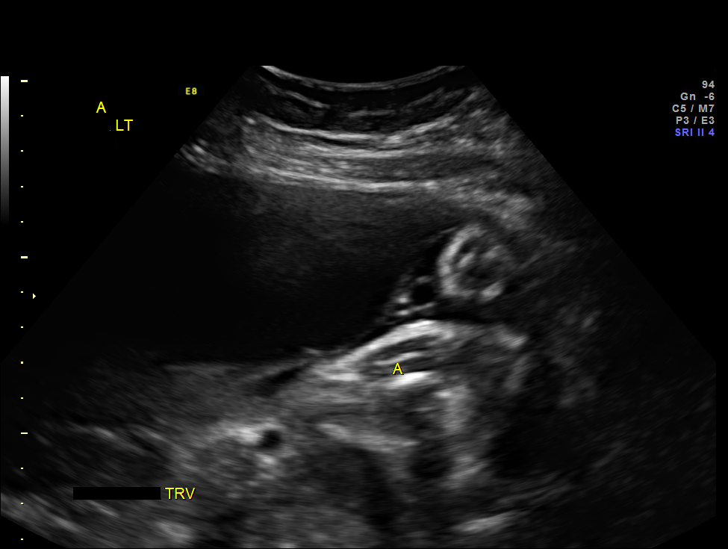
[im 11/30]
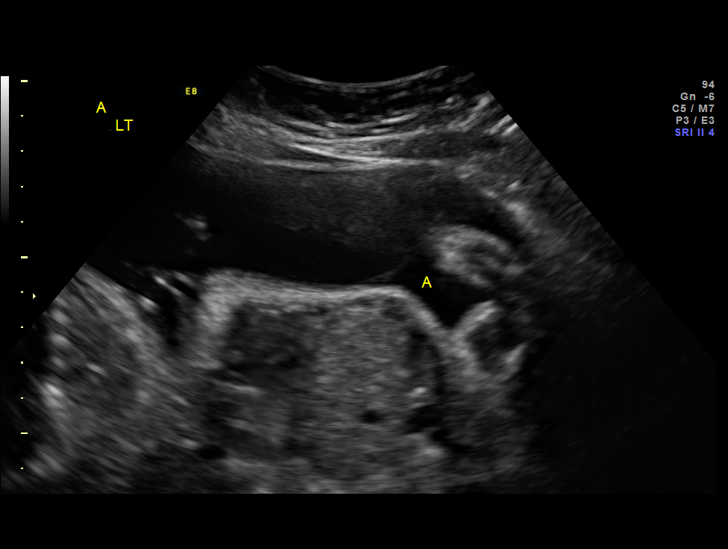
[im 13/30]
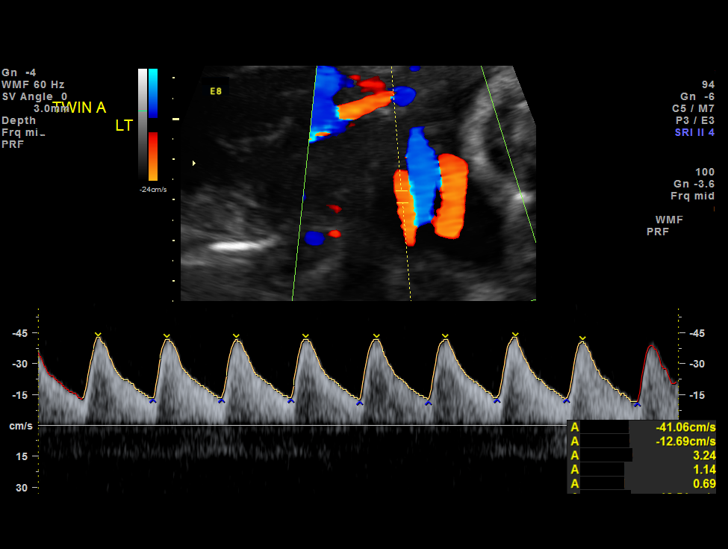
[im 17/30]
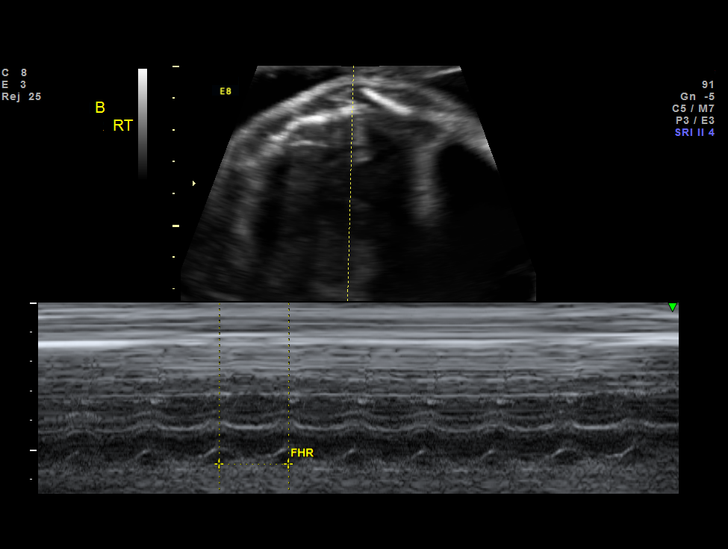
[im 19/30]
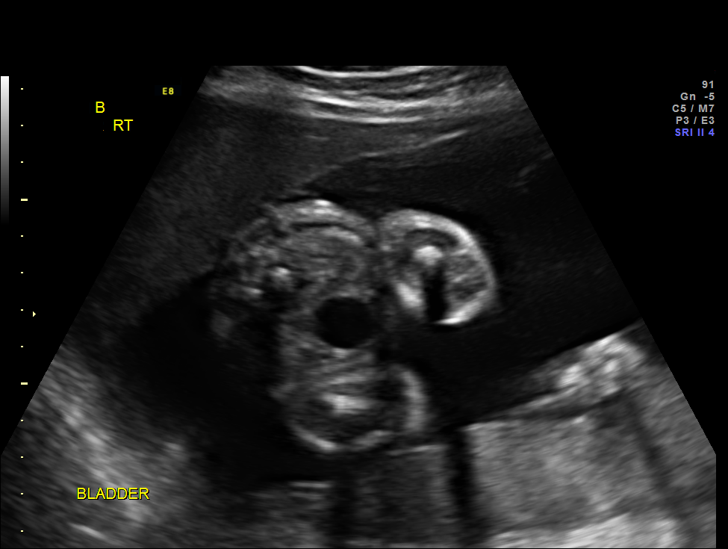
[im 21/30]
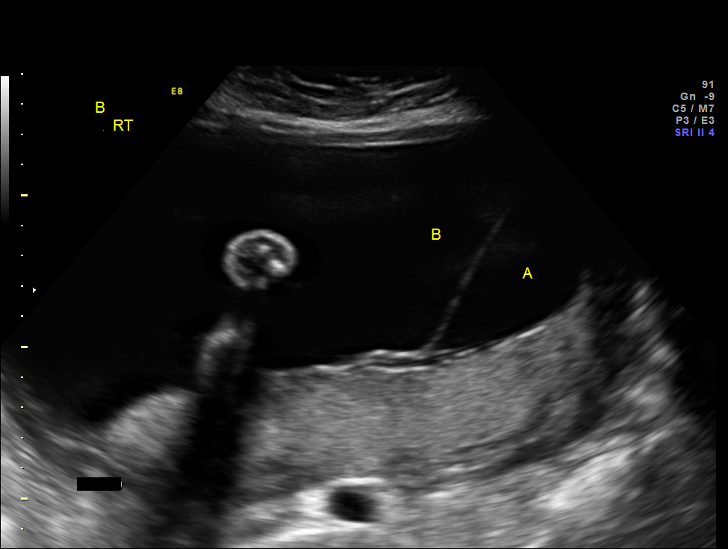
[im 24/30]
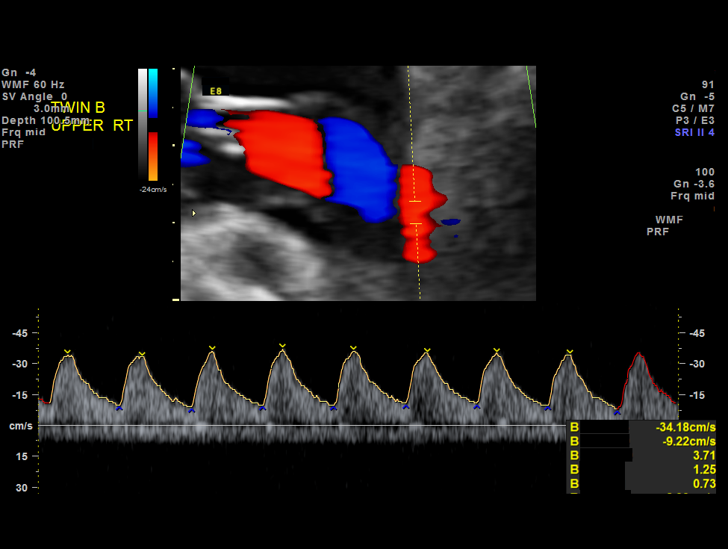
[im 26/30]
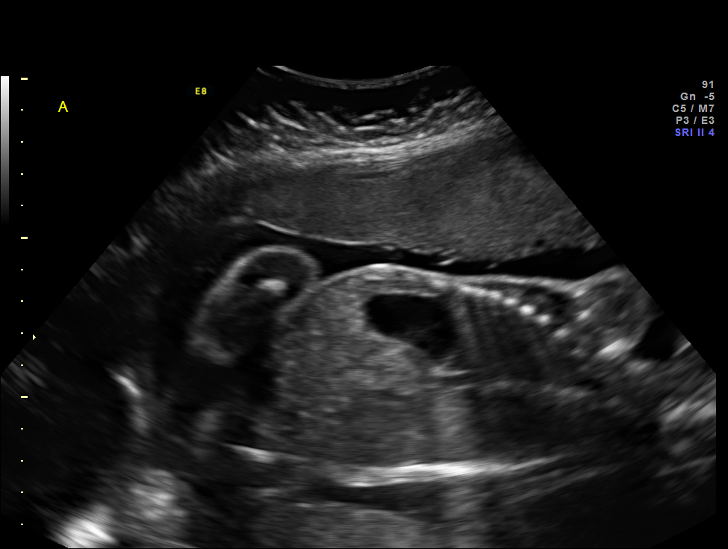
[im 28/30]
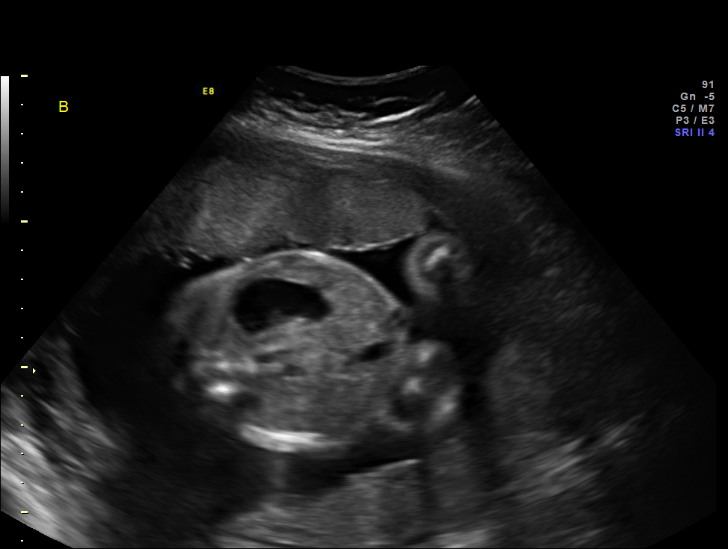

[12 of 28 positions shown; findings below may reference images not displayed]

OBSTETRICS REPORT
                      (Signed Final 03/02/2013 [DATE])

Service(s) Provided

 US UA CORD DOPPLER                                    76820.0
 US UA ADDL GEST                                       76820.1
Indications

 Di-Di Twin pregnancy, twins discordant, antepartum
 Size less than dates (Small for gestational [AGE]
 Twin B (AC<3%)
 Polyhydramnios - Twin B
 Poor obstetric history: Previous preterm delivery
 (36 weeks)
Fetal Evaluation (Fetus A)

 Num Of Fetuses:    2
 Fetal Heart Rate:  145                          bpm
 Cardiac Activity:  Observed
 Fetal Lie:         Lower left Fetus
 Presentation:      Breech
 Placenta:          Posterior, above cervical
                    os
 P. Cord            Previously Visualized
 Insertion:

 Membrane Desc:     Dividing
                    Membrane seen
                    - Dichorionic.

 Amniotic Fluid
 AFI FV:      Subjectively within normal limits
                                             Larg Pckt:     3.9  cm
Biophysical Evaluation (Fetus A)

 Amniotic F.V:   Pocket => 2 cm two         F. Tone:        Observed
                 planes
 F. Movement:    Observed                   Score:          [DATE]
 F. Breathing:   Observed
Gestational Age (Fetus A)
 LMP:           27w 6d        Date:  08/19/12                 EDD:   05/26/13
 Best:          27w 6d     Det. By:  LMP  (08/19/12)          EDD:   05/26/13
Doppler - Fetal Vessels (Fetus A)

 Umbilical Artery
 S/D:   3.14           54  %tile       RI:
 PI:    1.13                           PSV:       48.81   cm/s
 Umbilical Artery
 Absent DFV:    No     Reverse DFV:    No

Fetal Evaluation (Fetus B)

 Num Of Fetuses:    2
 Fetal Heart Rate:  145                          bpm
 Cardiac Activity:  Observed
 Fetal Lie:         Upper right Fetus
 Presentation:      Cephalic
 Placenta:          Anterior right, above
                    cervical os
 P. Cord            Previously Visualized
 Insertion:

 Membrane Desc:     Dividing
                    Membrane seen
                    - Dichorionic.

 Amniotic Fluid
 AFI FV:      Subjectively increased
                                             Larg Pckt:     9.5  cm
Biophysical Evaluation (Fetus B)

 Amniotic F.V:   Pocket => 2 cm two         F. Tone:        Observed
                 planes
 F. Movement:    Observed                   Score:          [DATE]
 F. Breathing:   Observed
Gestational Age (Fetus B)

 LMP:           27w 6d        Date:  08/19/12                 EDD:   05/26/13
 Best:          27w 6d     Det. By:  LMP  (08/19/12)          EDD:   05/26/13
Doppler - Fetal Vessels (Fetus B)

 Umbilical Artery
 S/D:   3.17           55  %tile       RI:
 PI:    1.1                            PSV:       38.39   cm/s
 Umbilical Artery
 Absent DFV:    No     Reverse DFV:    No

Impression

 DC/DA twins at 276/7 weeks
 Follow up due to lagging growth and polyhydramnios of Twin
 B
 BPP [DATE] x 2
 Normal UA Doppler studies for gestational age with both twins
 Mild polyhydramnios (Twin B).  Normal amnotic fluid (Twin A)
Recommendations

 Continue weekly BPPs / UA Dopplers.
 Follow up growth in 2 weeks.

## 2013-03-02 NOTE — Progress Notes (Signed)
Alzina Bobak  was seen today for an ultrasound appointment.  See full report in [facility].  Impression: DC/DA twins at 276/7 weeks Follow up due to lagging growth and polyhydramnios of Twin B BPP 8/8 x 2 Normal UA Doppler studies for gestational age with both twins Mild polyhydramnios (Twin B).  Normal amnotic fluid (Twin A)  Recommendations: Continue weekly BPPs / UA Dopplers. Follow up growth in 2 weeks.  Alpha Gula, MD

## 2013-03-09 ENCOUNTER — Inpatient Hospital Stay (HOSPITAL_COMMUNITY)
Admission: AD | Admit: 2013-03-09 | Discharge: 2013-03-09 | Disposition: A | Discharge status: Home / Self Care | Payer: BC Managed Care – PPO | Source: Ambulatory Visit | Attending: Obstetrics and Gynecology | Admitting: Obstetrics and Gynecology

## 2013-03-09 ENCOUNTER — Encounter (HOSPITAL_COMMUNITY): Payer: Self-pay | Admitting: *Deleted

## 2013-03-09 ENCOUNTER — Ambulatory Visit (HOSPITAL_COMMUNITY)
Admit: 2013-03-09 | Discharge: 2013-03-09 | Disposition: A | Discharge status: Home / Self Care | Payer: BC Managed Care – PPO

## 2013-03-09 ENCOUNTER — Other Ambulatory Visit (HOSPITAL_COMMUNITY): Payer: Self-pay | Admitting: Maternal and Fetal Medicine

## 2013-03-09 ENCOUNTER — Ambulatory Visit (HOSPITAL_COMMUNITY)
Admission: RE | Admit: 2013-03-09 | Discharge: 2013-03-09 | Disposition: A | Discharge status: Home / Self Care | Payer: BC Managed Care – PPO | Source: Ambulatory Visit | Attending: Obstetrics and Gynecology | Admitting: Obstetrics and Gynecology

## 2013-03-09 ENCOUNTER — Other Ambulatory Visit: Payer: Self-pay | Admitting: Obstetrics and Gynecology

## 2013-03-09 DIAGNOSIS — O409XX0 Polyhydramnios, unspecified trimester, not applicable or unspecified: Secondary | ICD-10-CM | POA: Insufficient documentation

## 2013-03-09 DIAGNOSIS — Z8751 Personal history of pre-term labor: Secondary | ICD-10-CM | POA: Insufficient documentation

## 2013-03-09 DIAGNOSIS — O365922 Maternal care for other known or suspected poor fetal growth, second trimester, fetus 2: Secondary | ICD-10-CM

## 2013-03-09 DIAGNOSIS — O30009 Twin pregnancy, unspecified number of placenta and unspecified number of amniotic sacs, unspecified trimester: Secondary | ICD-10-CM | POA: Insufficient documentation

## 2013-03-09 DIAGNOSIS — O3090 Multiple gestation, unspecified, unspecified trimester: Secondary | ICD-10-CM | POA: Insufficient documentation

## 2013-03-09 DIAGNOSIS — O365990 Maternal care for other known or suspected poor fetal growth, unspecified trimester, not applicable or unspecified: Secondary | ICD-10-CM | POA: Insufficient documentation

## 2013-03-09 DIAGNOSIS — O26873 Cervical shortening, third trimester: Secondary | ICD-10-CM

## 2013-03-09 DIAGNOSIS — O403XX2 Polyhydramnios, third trimester, fetus 2: Secondary | ICD-10-CM

## 2013-03-09 DIAGNOSIS — O30049 Twin pregnancy, dichorionic/diamniotic, unspecified trimester: Secondary | ICD-10-CM

## 2013-03-09 DIAGNOSIS — O26879 Cervical shortening, unspecified trimester: Secondary | ICD-10-CM

## 2013-03-09 IMAGING — US US FETAL BPP W/O NONSTRESS
1 series · 16 of 28 positions shown · non-contrast
Comparison: none

OBSTETRICS REPORT
                      (Signed Final 03/09/2013 [DATE])

Service(s) Provided
 US UA CORD DOPPLER                                    76820.0
 US UA ADDL GEST                                       76820.1
 US OB TRANSVAGINAL                                    76817.0
Indications
 Di-Di Twin pregnancy, twins discordant, antepartum
 Size less than dates (Small for gestational [AGE]
 Twin B (AC<3%)
 Polyhydramnios - Twin B
 Poor obstetric history: Previous preterm delivery
 (36 weeks)
Fetal Evaluation (Fetus A)
 Num Of Fetuses:    2
 Fetal Heart Rate:  131                          bpm
 Cardiac Activity:  Observed
 Fetal Lie:         Maternal left side
 Presentation:      Breech
 Placenta:          Posterior, above cervical
                    os
 Amniotic Fluid
 AFI FV:      Subjectively within normal limits
                                             Larg Pckt:     4.5  cm
Biophysical Evaluation (Fetus A)
 Amniotic F.V:   Pocket => 2 cm two         F. Tone:        Observed
                 planes
 F. Movement:    Observed                   Score:          [DATE]
 F. Breathing:   Observed
Gestational Age (Fetus A)
 LMP:           28w 6d        Date:  08/19/12                 EDD:   05/26/13
 Best:          28w 6d     Det. By:  LMP  (08/19/12)          EDD:   05/26/13
Doppler - Fetal Vessels (Fetus A)
 Umbilical Artery
 S/D:   3.6            77  %tile       RI:
 PI:    1.04                           PSV:       41.63   cm/s
Fetal Evaluation (Fetus B)
 Fetal Heart Rate:  157                          bpm
 Fetal Lie:         Maternal right side
 Presentation:      Cephalic
 Placenta:          Anterior, above cervical os
 Membrane Desc:     Dividing
                    Membrane seen
 AFI FV:      Polyhydramnios
                                             Larg Pckt:    10.4  cm
Biophysical Evaluation (Fetus B)
 Amniotic F.V:   Polyhydramnios             F. Tone:        Observed
Gestational Age (Fetus B)
Doppler - Fetal Vessels (Fetus B)
 S/D:   2.49           25  %tile       RI:
 PI:    0.92                           PSV:       41.96   cm/s
Cervix Uterus Adnexa
 Cervical Length:    1        cm       Funnel Width:   1.9       cm
 Funnel Length:      1.4      cm
 Cervix:       Appears funnelled, see comments.
Impression
INDICATION: 26 yr old [id number] at 33w8d with
 dichorionic/diamniotic twin gestation; previous preterm
 delivery, and twin B with lagging abdominal circumference for
 BPPs and Doppler studies.

[Series 1: us fetal bpp w/o nonstress · 0.26mm/px · 16 of 29 slices shown]
[im 1/29]
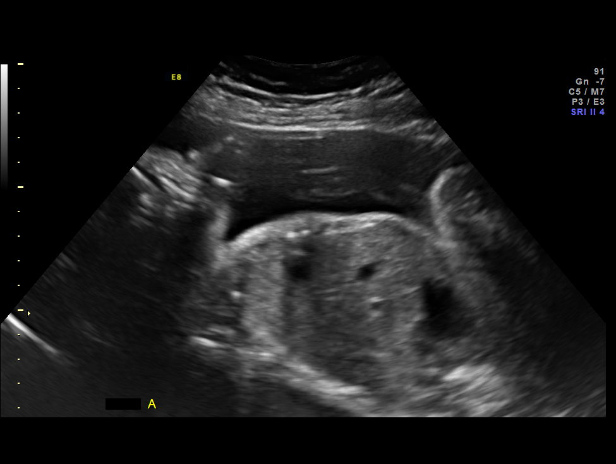
[im 3/29]
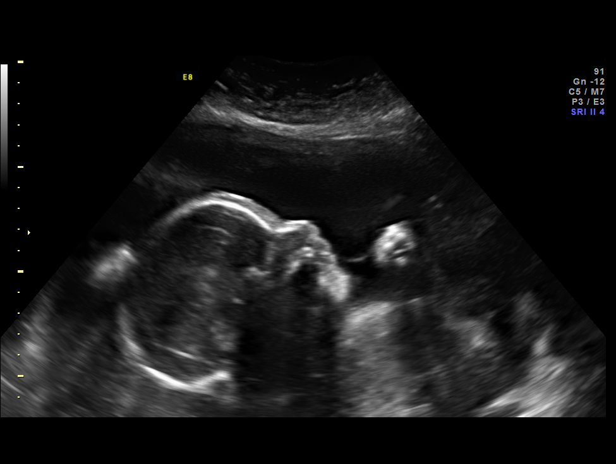
[im 5/29]
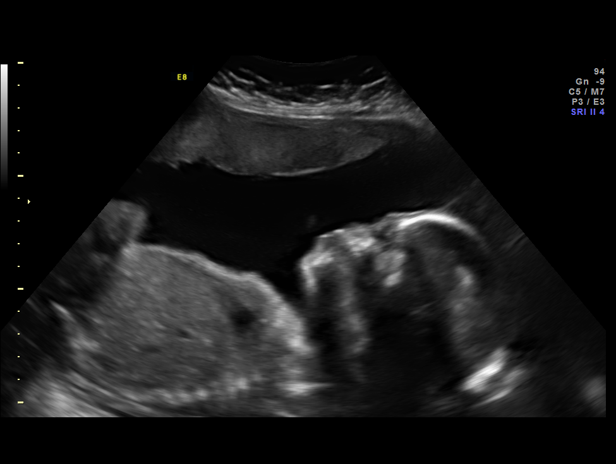
[im 7/29]
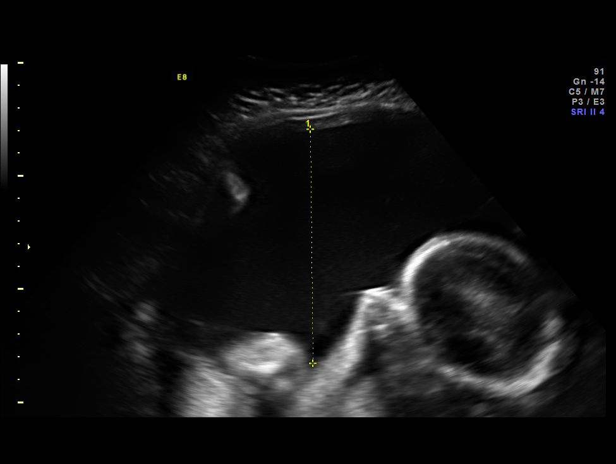
[im 8/29]
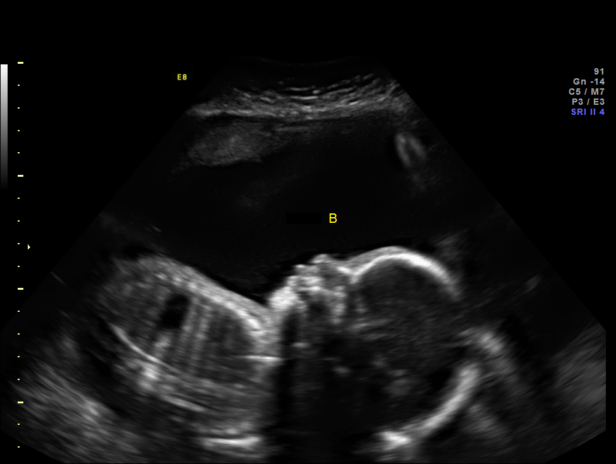
[im 10/29]
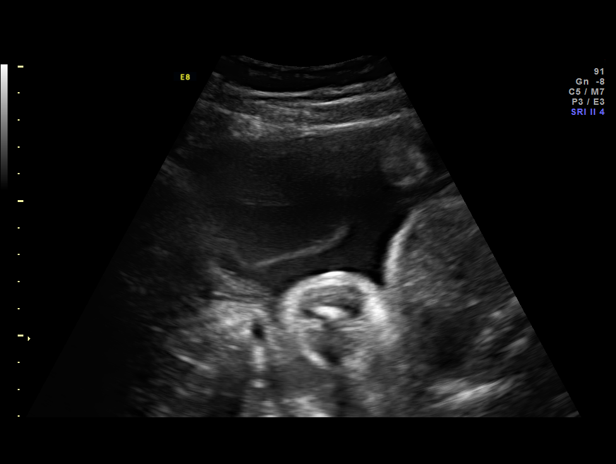
[im 12/29]
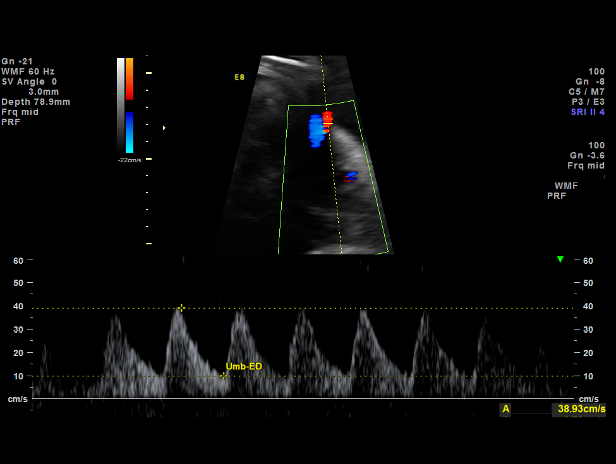
[im 14/29]
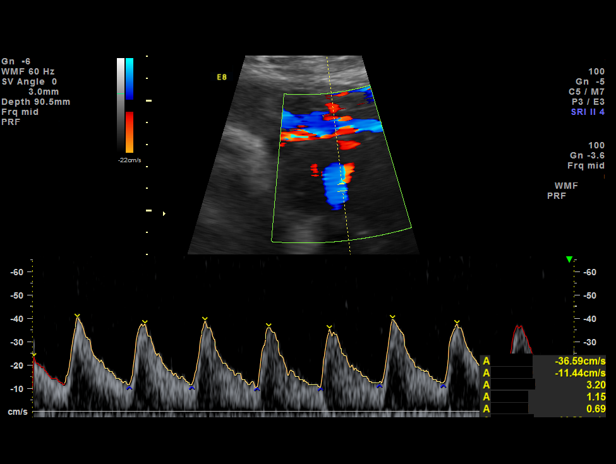
[im 15/29]
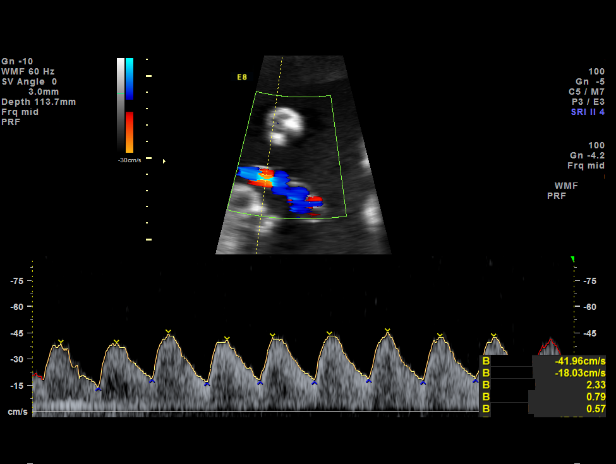
[im 17/29]
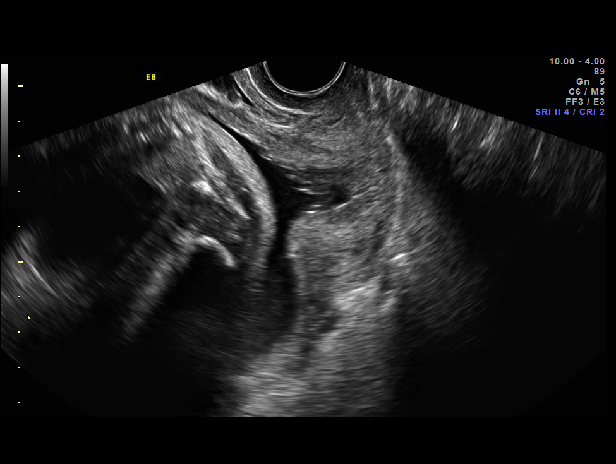
[im 19/29]
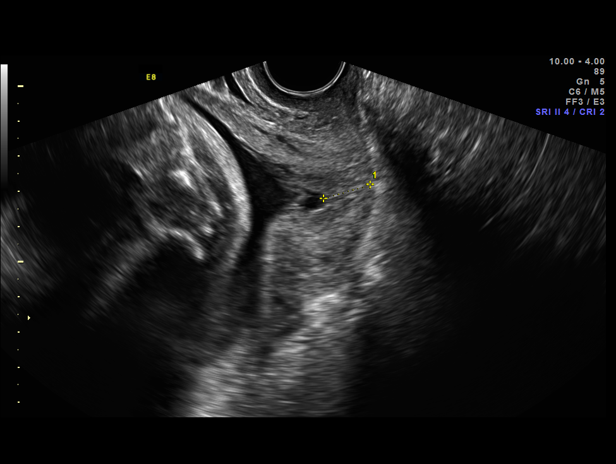
[im 21/29]
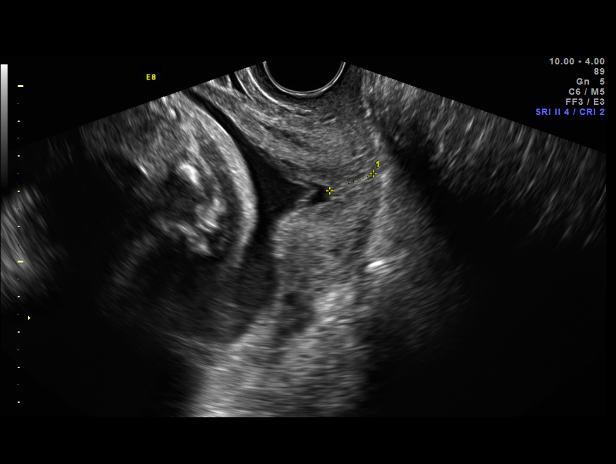
[im 22/29]
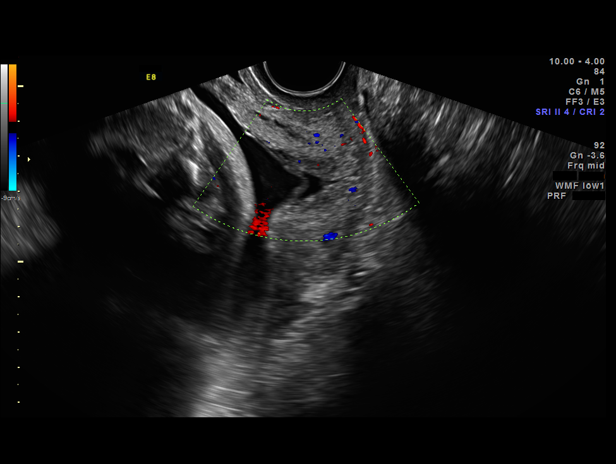
[im 24/29]
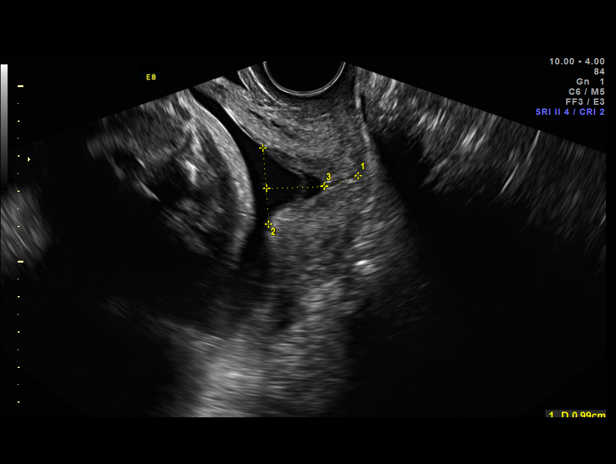
[im 26/29]
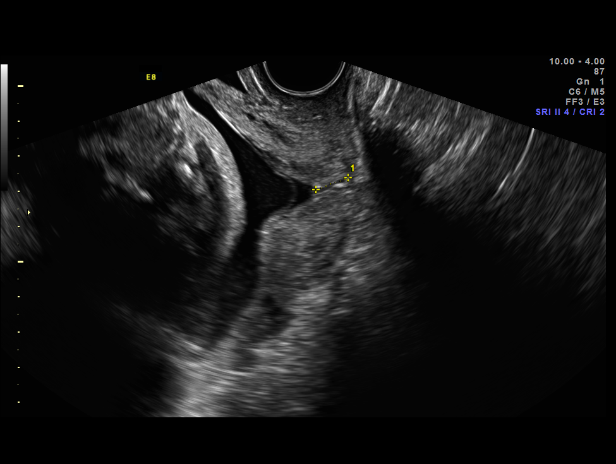
[im 29/29]
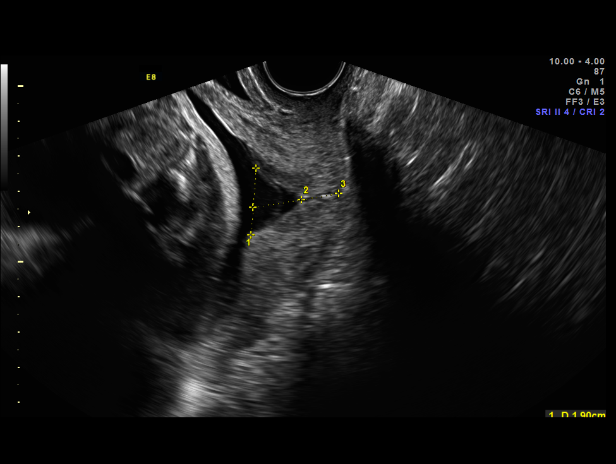

[16 of 28 positions shown; findings below may reference images not displayed]

FINDINGS: 1. Dichorionic/diamniotic twin gestation; the dividing
 membrane is seen.
 2. Twin A with a posterior placenta; twin B with an anterior
 placenta; there is no evidence of previa.
 3. Twin A is in breech presentation; twin B is in cephalic
 presentation.
 4. Normal amniotic fluid volume for twin A; polyhydramnios in
 twin B with maximum vertical pocket of 10.4cm.
 5. Normal biophysical profiles of [DATE] for both fetuses.
 6. Normal umbilical artery Doppler studies for both fetuses.
 7. Shortened transvaginal cervical length of 1cm; there is
 funneling at the internal cervical os.
Recommendations

 1. Dichorionic twin pregnancy:
 - previously counseled
 2. Lagging abdominal circumference in twin B:
 - previously counseled
 - recommend fetal growth every 2-3 weeks; due in 1 week
 - recommend continue antenatal testing with weekly
 biophysical profiles and umbilical artery Doppler studies-
 reassuring today
 3. Polyhydramnios for twin B:
 - unclear etiology
 - discussed possible etiologies: idiopathic, gestational
 diabetes, aneuploidy, fetal infection, anomalies,
 neuromuscular disorder
 - discussed given otherwise normal anatomy ultrasound and
 normal fetal movement this is likely idiopathic or due to
 gestational diabetes- patient had elevated 1 hour glucola and
 is having 2 hour gtt this week
 - discussed increased risk of preterm labor/PPROM and
 stillbirth
 - recommend continue antenatal testing as above and
 proceed with 3 hour gtt
 4. Short cervix:
 - see consult letter
 - discussed increased risk of preterm delivery
 - patient having pelvic pressure
 - recommend observation in triage for contractions and
 cervical dilation/change- if no contractions and little or no
 cervical dilation recommend course of Kally as an
 outpatient with close follow up
 - if contractions and/or cervical dilation/change recommend
 admit until through steroid window then repeat cervical exam
 and if unchanged can likely be managed as an outpatient
 with close follow up
 - if contractions recommend tocolyzing through steroid
 window
 - if delivery is imminent prior to 32 weeks recommend
 magnesium sulfate for fetal neuroprotection
 - would recommend decreased physical activity and strict
 preterm labor/PPROM precautions

 Recommendations discussed with Dr. Bappi and patient
 sent to [facility]

## 2013-03-09 MED ORDER — BETAMETHASONE SOD PHOS & ACET 6 (3-3) MG/ML IJ SUSP
12.0000 mg | Freq: Once | INTRAMUSCULAR | Status: AC
  Administered 2013-03-09: 12 mg via INTRAMUSCULAR
  Filled 2013-03-09: qty 2

## 2013-03-09 NOTE — [facility] Note (Signed)
Sent from [facility] for shortened cervix and further evaluation;

## 2013-03-09 NOTE — Progress Notes (Signed)
[facility] ultrasound  Indication: 27 yr old [id number] at [redacted]w[redacted]d with dichorionic/diamniotic twin gestation; previous preterm delivery, and twin B with lagging abdominal circumference for BPPs and Doppler studies.  Findings: 1. Dichorionic/diamniotic twin gestation; the dividing membrane is seen. 2. Twin A with a posterior placenta; twin B with an anterior placenta; there is no evidence of previa. 3. Twin A is in breech presentation; twin B is in cephalic presentation. 4. Normal amniotic fluid volume for twin A; polyhydramnios in twin B with maximum vertical pocket of 10.4cm. 5. Normal biophysical profiles of 8/8 for both fetuses. 6. Normal umbilical artery Doppler studies for both fetuses. 7. Shortened transvaginal cervical length of 1cm; there is funneling at the internal cervical os.  Recommendations: 1. Dichorionic twin pregnancy: - previously counseled 2. Lagging abdominal circumference in twin B: - previously counseled - recommend fetal growth every 2-3 weeks; due in 1 week - recommend continue antenatal testing with weekly biophysical profiles and umbilical artery Doppler studies- reassuring today 3. Polyhydramnios for twin B: - unclear etiology - discussed possible etiologies: idiopathic, gestational diabetes, aneuploidy, fetal infection, anomalies, neuromuscular disorder - discussed given otherwise normal anatomy ultrasound and normal fetal movement this is likely idiopathic or due to gestational diabetes- patient had elevated 1 hour glucola and is having 2 hour gtt this week - discussed increased risk of preterm labor/PPROM and stillbirth - recommend continue antenatal testing as above and proceed with 3 hour gtt 4. Short cervix: - see consult letter - discussed increased risk of preterm delivery - patient having pelvic pressure - recommend observation in triage for contractions and cervical dilation/change- if no contractions and little or no cervical dilation  recommend course of betamethasone as an outpatient with close follow up - if contractions and/or cervical dilation/change recommend admit until through steroid window then repeat cervical exam and if unchanged can likely be managed as an outpatient with close follow up - if contractions recommend tocolyzing through steroid window - if delivery is imminent prior to 32 weeks recommend magnesium sulfate for fetal neuroprotection - would recommend decreased physical activity and strict preterm labor/PPROM precautions  Recommendations discussed with Dr. Ellyn Hack and patient sent to [facility]  Eulis Foster, MD

## 2013-03-09 NOTE — Consult Note (Addendum)
MFM consult  27 yr old G14P0101 at [redacted]w[redacted]d with dichorionic/diamniotic twin gestation; previous preterm delivery, and twin B with lagging abdominal circumference referred by Dr. Ashok Norris for BPPs and Doppler studies and consult.  Ultrasound today shows: dichorionic/diamniotic twin gestation; the dividing membrane is seen. Twin A with a posterior placenta; twin B with an anterior placenta; there is no evidence of previa. Twin A is in breech presentation; twin B is in cephalic presentation. Normal amniotic fluid volume for twin A; polyhydramnios in twin B with maximum vertical pocket of 10.4cm. Normal biophysical profiles of 8/8 for both fetuses. Normal umbilical artery Doppler studies for both fetuses. Shortened transvaginal cervical length of 1cm; there is funneling at the internal cervical os.  I counseled the patient as follows: 1. Dichorionic twin pregnancy: - previously counseled 2. Lagging abdominal circumference in twin B: - previously counseled - recommend fetal growth every 2-3 weeks; due in 1 week - recommend continue antenatal testing with weekly biophysical profiles and umbilical artery Doppler studies- reassuring today 3. Polyhydramnios for twin B: - unclear etiology - discussed possible etiologies: idiopathic, gestational diabetes, aneuploidy, fetal infection, anomalies, neuromuscular disorder - discussed given otherwise normal anatomy ultrasound and normal fetal movement this is likely idiopathic or due to gestational diabetes- patient had elevated 1 hour glucola and is having 2 hour gtt this week - discussed increased risk of preterm labor/PPROM and stillbirth - recommend continue antenatal testing as above and proceed with 3 hour gtt 4. Short cervix: - discussed increased risk of preterm delivery - patient having pelvic pressure - recommend observation in triage for contractions and cervical dilation/change- if no contractions and little or no cervical dilation recommend course of  betamethasone as an outpatient with close follow up - if contractions and/or cervical dilation/change recommend admit until through steroid window then repeat cervical exam and if unchanged can likely be managed as an outpatient with close follow up - if contractions recommend tocolyzing through steroid window - if delivery is imminent prior to 32 weeks recommend magnesium sulfate for fetal neuroprotection - would recommend decreased physical activity and strict preterm labor/PPROM precautions - recommend NICU consult  Recommendations discussed with Dr. Ellyn Hack and patient sent to MAU  I spent 30 minutes in face to face consultation with the patient in addition to time spent on the ultrasound.  Eulis Foster, MD

## 2013-03-09 NOTE — MAU Provider Note (Signed)
Chief Complaint:  Labor Eval   None     HPI: Brittany Cunningham is a 27 y.o. G2P0101 at [redacted]w[redacted]d with twins pregnancy who presents to maternity admissions sent from MFM ultrasound for shortening cervix.  She reports constant pelvic pressure, increased when lying down, decreased when standing.  She reports good fetal movement, denies cramping/contractions, LOF, vaginal bleeding, vaginal itching/burning, urinary symptoms, h/a, dizziness, n/v, or fever/chills.     Past Medical History: Past Medical History  Diagnosis Date  . Thyroid disease     currently not treated with meds    Past obstetric history: OB History  Gravida Para Term Preterm AB SAB TAB Ectopic Multiple Living  2 1 0 1 0 0 0 0 0 1     # Outcome Date GA Lbr Len/2nd Weight Sex Delivery Anes PTL Lv  2 CUR           1 PRE               Past Surgical History: Past Surgical History  Procedure Laterality Date  . No past surgeries      Family History: Family History  Problem Relation Age of Onset  . Thyroid disease Mother   . Hypertension Father   . Diabetes Maternal Grandmother     Social History: History  Substance Use Topics  . Smoking status: Never Smoker   . Smokeless tobacco: Not on file  . Alcohol Use: No    Allergies: No Known Allergies  Meds:  Prescriptions prior to admission  Medication Sig Dispense Refill  . diphenhydramine-acetaminophen (TYLENOL PM) 25-500 MG TABS Take 2 tablets by mouth at bedtime as needed.      . Prenatal Vit-Fe Fumarate-FA (PRENATAL MULTIVITAMIN) TABS tablet Take 1 tablet by mouth daily at 12 noon.        ROS: Pertinent findings in history of present illness.  Physical Exam  Blood pressure 123/82, pulse 113, temperature 98.2 F (36.8 C), temperature source Oral, resp. rate 18, last menstrual period 08/19/2012. GENERAL: Well-developed, well-nourished female in no acute distress.  HEENT: normocephalic HEART: normal rate RESP: normal effort ABDOMEN: Soft, non-tender, gravid  appropriate for gestational age EXTREMITIES: Nontender, no edema NEURO: alert and oriented    Baby A: FHT:  Baseline 140 , moderate variability, accelerations present, no decelerations Contractions: None on toco or to palpation Baby B: FHT:  Baseline 145 , moderate variability, accelerations present, no decelerations Contractions: None on toco or to palpation   Dilation: 1 Effacement (%): 50 Cervical Position: Posterior Station: Ballotable Exam by:: L Leftwich-Kirby CNM  In 1.5 hours cervix unchanged: Dilation: 1 Effacement (%): 50 Cervical Position: Posterior Station: Ballotable Exam by:: L Leftwich-Kirby CNM Imaging:  [facility] ultrasound  Indication: 27 yr old [name] at [redacted]w[redacted]d with dichorionic/diamniotic twin gestation; previous preterm delivery, and twin B with lagging abdominal circumference for BPPs and Doppler studies.  Findings:  1. Dichorionic/diamniotic twin gestation; the dividing membrane is seen.  2. Twin A with a posterior placenta; twin B with an anterior placenta; there is no evidence of previa.  3. Twin A is in breech presentation; twin B is in cephalic presentation.  4. Normal amniotic fluid volume for twin A; polyhydramnios in twin B with maximum vertical pocket of 10.4cm.  5. Normal biophysical profiles of 8/8 for both fetuses.  6. Normal umbilical artery Doppler studies for both fetuses.  7. Shortened transvaginal cervical length of 1cm; there is funneling at the internal cervical os.  Recommendations:  1. Dichorionic twin pregnancy:  -  previously counseled  2. Lagging abdominal circumference in twin B:  - previously counseled  - recommend fetal growth every 2-3 weeks; due in 1 week  - recommend continue antenatal testing with weekly biophysical profiles and umbilical artery Doppler studies- reassuring today  3. Polyhydramnios for twin B:  - unclear etiology  - discussed possible etiologies: idiopathic, gestational diabetes,  aneuploidy, fetal infection, anomalies, neuromuscular disorder  - discussed given otherwise normal anatomy ultrasound and normal fetal movement this is likely idiopathic or due to gestational diabetes- patient had elevated 1 hour glucola and is having 2 hour gtt this week  - discussed increased risk of preterm labor/PPROM and stillbirth  - recommend continue antenatal testing as above and proceed with 3 hour gtt  4. Short cervix:  - see consult letter  - discussed increased risk of preterm delivery  - patient having pelvic pressure  - recommend observation in triage for contractions and cervical dilation/change- if no contractions and little or no cervical dilation recommend course of betamethasone as an outpatient with close follow up  - if contractions and/or cervical dilation/change recommend admit until through steroid window then repeat cervical exam and if unchanged can likely be managed as an outpatient with close follow up  - if contractions recommend tocolyzing through steroid window  - if delivery is imminent prior to 32 weeks recommend magnesium sulfate for fetal neuroprotection  - would recommend decreased physical activity and strict preterm labor/PPROM precautions  Recommendations discussed with Dr. Ellyn Hack and patient sent to MAU  Eulis Foster, MD    Assessment: 1. Short cervix affecting pregnancy, third trimester     Plan:  Consult with Dr Ellyn Hack Betamethasone x1 dose in MAU today Discharge home PTL precautions and fetal kick counts Return to MAU tomorrow for second betamethasone and FFN 24 hours from exam (2:50 pm) Return sooner as needed    Medication List    ASK your doctor about these medications       diphenhydramine-acetaminophen 25-500 MG Tabs  Commonly known as:  TYLENOL PM  Take 2 tablets by mouth at bedtime as needed.     prenatal multivitamin Tabs tablet  Take 1 tablet by mouth daily at 12 noon.        Sharen Counter Certified  Nurse-Midwife 03/09/2013 1:17 PM

## 2013-03-10 ENCOUNTER — Encounter (HOSPITAL_COMMUNITY): Payer: Self-pay | Admitting: *Deleted

## 2013-03-10 ENCOUNTER — Inpatient Hospital Stay (HOSPITAL_COMMUNITY)
Admission: AD | Admit: 2013-03-10 | Discharge: 2013-03-10 | Disposition: A | Discharge status: Home / Self Care | Payer: BC Managed Care – PPO | Source: Ambulatory Visit | Attending: Obstetrics and Gynecology | Admitting: Obstetrics and Gynecology

## 2013-03-10 DIAGNOSIS — O30009 Twin pregnancy, unspecified number of placenta and unspecified number of amniotic sacs, unspecified trimester: Secondary | ICD-10-CM | POA: Insufficient documentation

## 2013-03-10 DIAGNOSIS — O4703 False labor before 37 completed weeks of gestation, third trimester: Secondary | ICD-10-CM

## 2013-03-10 DIAGNOSIS — O26879 Cervical shortening, unspecified trimester: Secondary | ICD-10-CM | POA: Insufficient documentation

## 2013-03-10 DIAGNOSIS — O479 False labor, unspecified: Secondary | ICD-10-CM

## 2013-03-10 DIAGNOSIS — O4700 False labor before 37 completed weeks of gestation, unspecified trimester: Secondary | ICD-10-CM | POA: Insufficient documentation

## 2013-03-10 LAB — FETAL FIBRONECTIN: Fetal Fibronectin: NEGATIVE

## 2013-03-10 MED ORDER — BETAMETHASONE SOD PHOS & ACET 6 (3-3) MG/ML IJ SUSP
12.0000 mg | Freq: Once | INTRAMUSCULAR | Status: AC
  Administered 2013-03-10: 12 mg via INTRAMUSCULAR
  Filled 2013-03-10: qty 2

## 2013-03-10 MED ORDER — PROMETHAZINE HCL 25 MG PO TABS: 25.0000 mg | ORAL_TABLET | Freq: Four times a day (QID) | ORAL | Status: DC | PRN

## 2013-03-10 NOTE — MAU Provider Note (Signed)
Pt is [redacted]w[redacted]d pregnant with twins- pt was seen yesterday with preterm labor/shortened cervix but had been examined before fetal fibronectin was obtained. Pt is here for fetal fibronectin and for 2nd BetaMethasone injection Pt is alert and oriened in no acute distress occ ctx Results for orders placed during the hospital encounter of 03/10/13 (from the past 24 hour(s))  FETAL FIBRONECTIN     Status: None   Collection Time    03/10/13  2:50 PM      Result Value Range   Fetal Fibronectin NEGATIVE  NEGATIVE  results reported to Dr. Senaida Ores Pt to f/u with appointment scheduled with MFM and with [facility]

## 2013-03-16 ENCOUNTER — Ambulatory Visit (HOSPITAL_COMMUNITY)
Admission: RE | Admit: 2013-03-16 | Discharge: 2013-03-16 | Disposition: A | Discharge status: Home / Self Care | Payer: BC Managed Care – PPO | Source: Ambulatory Visit | Attending: Obstetrics and Gynecology | Admitting: Obstetrics and Gynecology

## 2013-03-16 ENCOUNTER — Encounter (HOSPITAL_COMMUNITY): Payer: Self-pay

## 2013-03-16 ENCOUNTER — Other Ambulatory Visit: Payer: Self-pay

## 2013-03-16 ENCOUNTER — Other Ambulatory Visit (HOSPITAL_COMMUNITY): Payer: Self-pay | Admitting: Maternal and Fetal Medicine

## 2013-03-16 DIAGNOSIS — O365922 Maternal care for other known or suspected poor fetal growth, second trimester, fetus 2: Secondary | ICD-10-CM

## 2013-03-16 DIAGNOSIS — Z8751 Personal history of pre-term labor: Secondary | ICD-10-CM | POA: Insufficient documentation

## 2013-03-16 DIAGNOSIS — O409XX0 Polyhydramnios, unspecified trimester, not applicable or unspecified: Secondary | ICD-10-CM | POA: Insufficient documentation

## 2013-03-16 DIAGNOSIS — O30009 Twin pregnancy, unspecified number of placenta and unspecified number of amniotic sacs, unspecified trimester: Secondary | ICD-10-CM

## 2013-03-16 DIAGNOSIS — O365990 Maternal care for other known or suspected poor fetal growth, unspecified trimester, not applicable or unspecified: Secondary | ICD-10-CM | POA: Insufficient documentation

## 2013-03-16 DIAGNOSIS — O26879 Cervical shortening, unspecified trimester: Secondary | ICD-10-CM | POA: Insufficient documentation

## 2013-03-16 IMAGING — US US OB FOLLOW-UP
1 series · 13 of 28 positions shown · non-contrast
Comparison: none

[Series 1: us ob follow-up · 0.23mm/px · 13 of 70 slices shown]
[im 3/70]
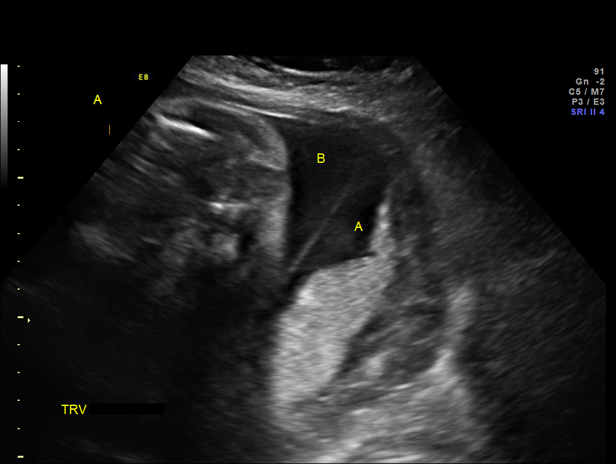
[im 8/70]
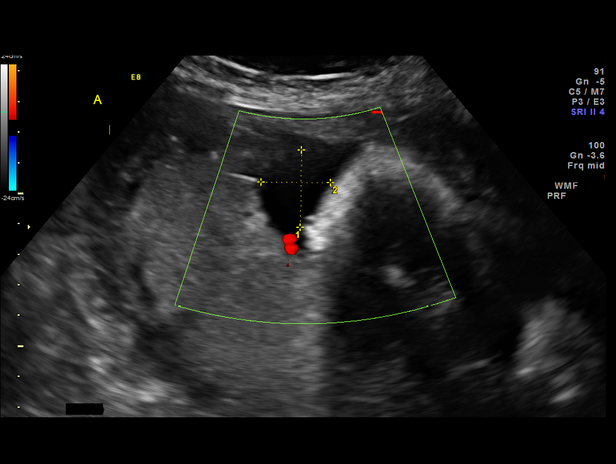
[im 13/70]
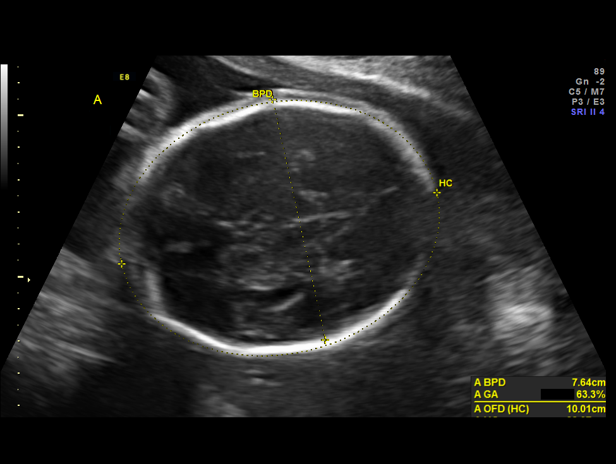
[im 18/70]
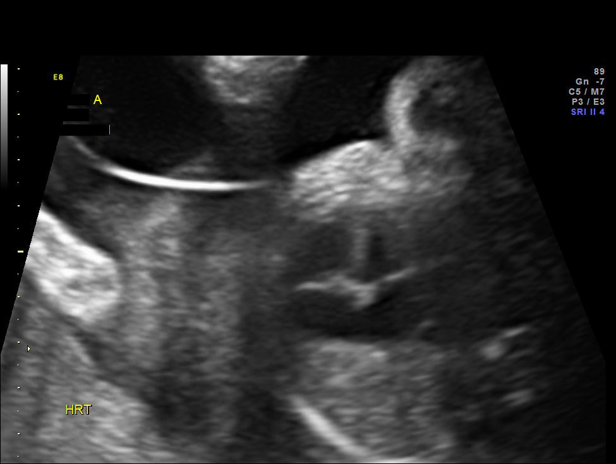
[im 24/70]
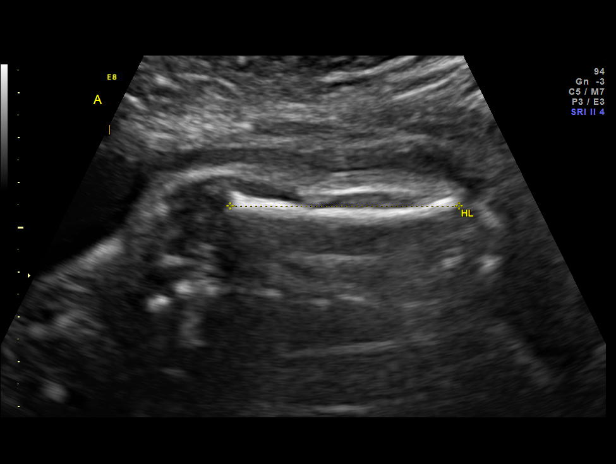
[im 29/70]
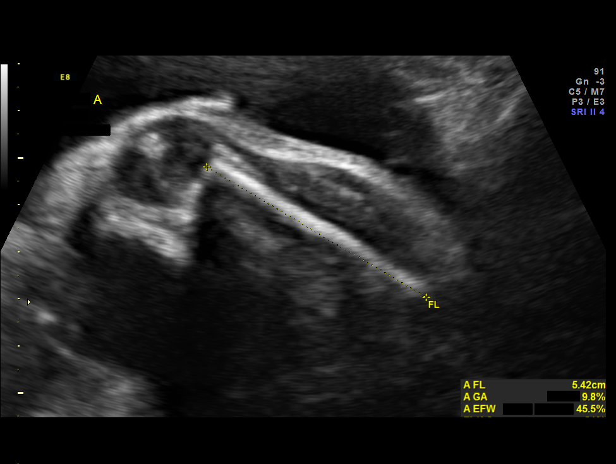
[im 36/70]
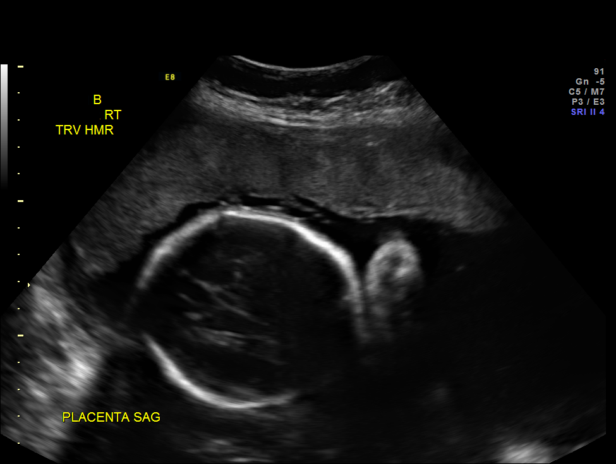
[im 41/70]
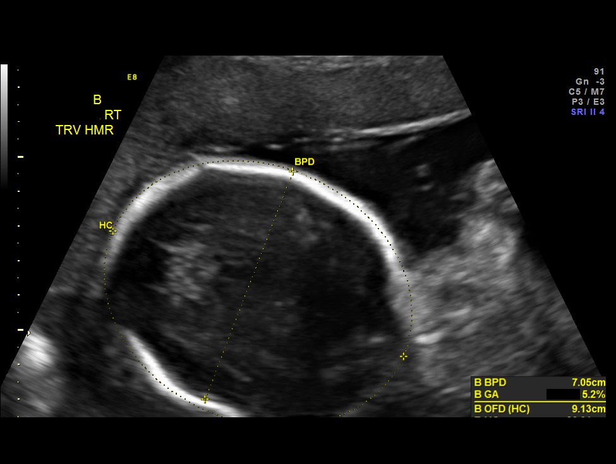
[im 47/70]
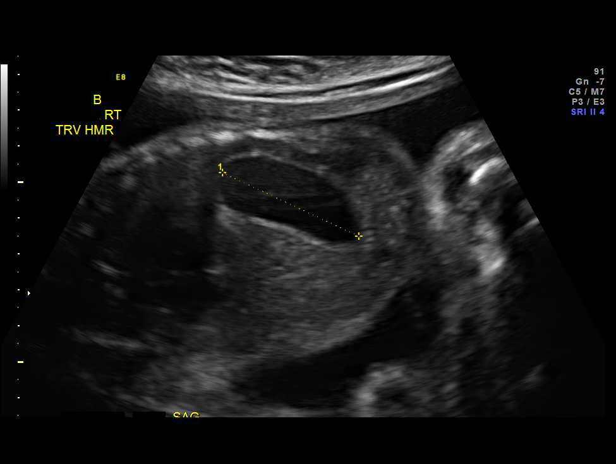
[im 52/70]
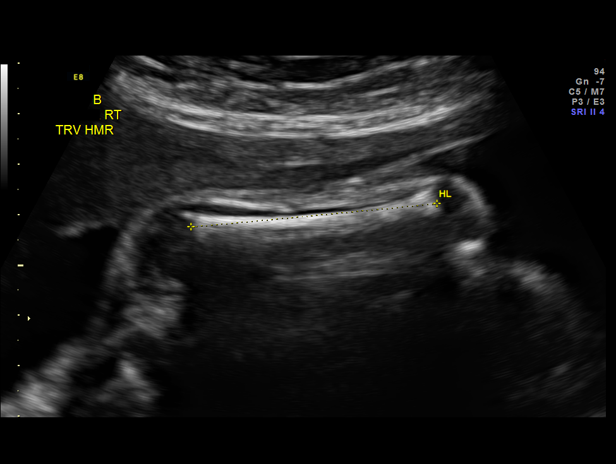
[im 57/70]
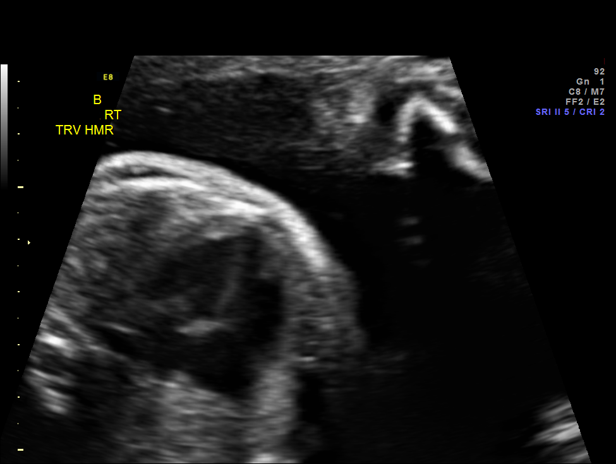
[im 62/70]
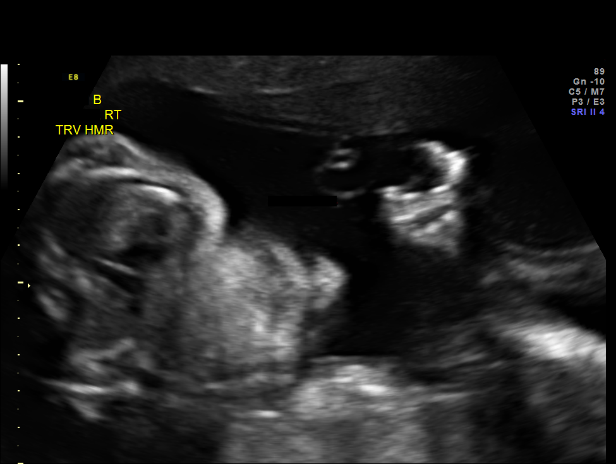
[im 67/70]
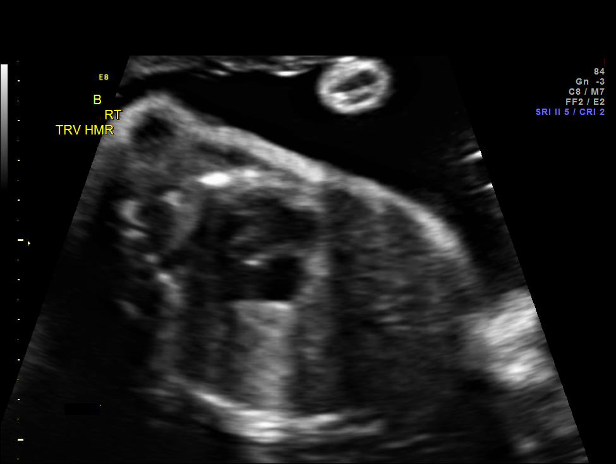

[13 of 28 positions shown; findings below may reference images not displayed]

OBSTETRICS REPORT
                      (Signed Final 03/16/2013 [DATE])

Service(s) Provided

 US OB FOLLOW UP                                       76816.1
 US OB FOLLOW UP ADDL GEST                             76816.2
 US UA CORD DOPPLER                                    76820.0
 US UA ADDL GEST                                       76820.1
Indications

 Di-Di Twin pregnancy, twins discordant, antepartum
 Size less than dates (Small for gestational [AGE]
 Twin B (AC<3%)
 Polyhydramnios - Twin B
 Poor obstetric history: Previous preterm delivery
 (36 weeks)
 Cervical shortening
Fetal Evaluation (Fetus A)

 Num Of Fetuses:    2
 Fetal Heart Rate:  147                          bpm
 Cardiac Activity:  Observed
 Fetal Lie:         Lower left Fetus
 Presentation:      Breech
 Placenta:          Posterior, above cervical
                    os
 P. Cord            Previously Visualized
 Insertion:

 Membrane Desc:     Dividing
                    Membrane seen
                    - Dichorionic.

 Amniotic Fluid
 AFI FV:      Subjectively within normal limits
                                             Larg Pckt:     3.0  cm
Biophysical Evaluation (Fetus A)

 Amniotic F.V:   Pocket => 2 cm two         F. Tone:        Observed
                 planes
 F. Movement:    Observed                   Score:          [DATE]
 F. Breathing:   Observed
Biometry (Fetus A)

 BPD:     76.7  mm     G. Age:  30w 5d                CI:         79.2   70 - 86
 OFD:     96.9  mm                                    FL/HC:      19.4   19.2 -

 HC:     276.8  mm     G. Age:  30w 2d       26  %    HC/AC:      1.09   0.99 -

 AC:     254.9  mm     G. Age:  29w 5d       39  %    FL/BPD:     70.0   71 - 87
 FL:      53.7  mm     G. Age:  28w 3d        8  %    FL/AC:      21.1   20 - 24
 HUM:     51.3  mm     G. Age:  30w 0d       54  %

 Est. FW:    5189  gm      3 lb 1 oz     44  %     FW Discordancy      0 \ 9 %
Gestational Age (Fetus A)

 LMP:           29w 6d        Date:  08/19/12                 EDD:   05/26/13
 U/S Today:     29w 5d                                        EDD:   05/27/13
 Best:          29w 6d     Det. By:  LMP  (08/19/12)          EDD:   05/26/13
Anatomy (Fetus A)

 Cranium:          Appears normal         Aortic Arch:      Not well visualized
 Fetal Cavum:      Appears normal         Ductal Arch:      Previously seen
 Ventricles:       Appears normal         Diaphragm:        Previously seen
 Choroid Plexus:   Previously seen        Stomach:          Appears normal, left
                                                            sided
 Cerebellum:       Previously seen        Abdomen:          Appears normal
 Posterior Fossa:  Previously seen        Abdominal Wall:   Appears nml (cord
                                                            insert, abd wall)
 Nuchal Fold:      Not applicable (>20    Cord Vessels:     Previously seen
                   wks GA)
 Face:             Orbits and profile     Kidneys:          Appear normal
                   previously seen
 Lips:             Previously seen        Bladder:          Appears normal
 Heart:            Appears normal         Spine:            Limited views prev
                   (4CH, axis, and                          seen as NL
                   situs)
 RVOT:             Previously seen        Lower             Previously seen
                                          Extremities:
 LVOT:             Previously seen        Upper             Previously seen
                                          Extremities:

 Other:  Fetus appears to be a male. Technically difficult due to advanced GA
         and fetal position.
Doppler - Fetal Vessels (Fetus A)

 Umbilical Artery
 S/D:   3.63           83  %tile       RI:
 PI:    1.18                           PSV:       45.66   cm/s
 Umbilical Artery
 Absent DFV:    No     Reverse DFV:    No

Fetal Evaluation (Fetus B)

 Num Of Fetuses:    2
 Fetal Heart Rate:  159                          bpm
 Cardiac Activity:  Observed
 Fetal Lie:         Upper right Fetus
 Presentation:      Cephalic
 Placenta:          Anterior right, above
                    cervical os
 P. Cord            Previously Visualized
 Insertion:

 Membrane Desc:     Dividing
                    Membrane seen
                    - Dichorionic.

 Amniotic Fluid
 AFI FV:      Polyhydramnios
                                             Larg Pckt:     7.4  cm
Biophysical Evaluation (Fetus B)

 Amniotic F.V:   Increased                  F. Tone:        Observed
 F. Movement:    Observed                   Score:          [DATE]
 F. Breathing:   Observed
Biometry (Fetus B)

 BPD:       71  mm     G. Age:  28w 4d                CI:         78.6   70 - 86
 OFD:     90.3  mm                                    FL/HC:      21.7   19.2 -

 HC:       259  mm     G. Age:  28w 1d      < 3  %    HC/AC:      1.09   0.99 -

 AC:     238.6  mm     G. Age:  28w 1d        7  %    FL/BPD:     79.3   71 - 87
 FL:      56.3  mm     G. Age:  29w 4d       28  %    FL/AC:      23.6   20 - 24
 HUM:       49  mm     G. Age:  28w 6d       29  %

 Est. FW:    3769  gm    2 lb 13 oz      28  %     FW Discordancy         9  %
Gestational Age (Fetus B)

 LMP:           29w 6d        Date:  08/19/12                 EDD:   05/26/13
 U/S Today:     28w 4d                                        EDD:   06/04/13
 Best:          29w 6d     Det. By:  LMP  (08/19/12)          EDD:   05/26/13
Anatomy (Fetus B)

 Cranium:          Appears normal         Aortic Arch:      Previously seen
 Fetal Cavum:      Appears normal         Ductal Arch:      Previously seen
 Ventricles:       Appears normal         Diaphragm:        Previously seen
 Choroid Plexus:   Previously seen        Stomach:          Appears normal, left
                                                            sided
 Cerebellum:       Previously seen        Abdomen:          Appears normal
 Posterior Fossa:  Previously seen        Abdominal Wall:   Previously seen
 Nuchal Fold:      Not applicable (>20    Cord Vessels:     Previously seen
                   wks GA)
 Face:             Orbits previously      Kidneys:          Appear normal
                   seen
 Lips:             Appears normal         Bladder:          Appears normal
 Heart:            Appears normal         Spine:            Previously seen
                   (4CH, axis, and
                   situs)
 RVOT:             Not well visualized    Lower             Previously seen
                                          Extremities:
 LVOT:             Appears normal         Upper             Previously seen
                                          Extremities:

 Other:  Fetus appears to be a female. Technically difficult due to advanced
         GA and fetal position.
Doppler - Fetal Vessels (Fetus B)

 Umbilical Artery
 S/D:   2.92           53  %tile       RI:
 PI:    1.06                           PSV:       41.32   cm/s
 Umbilical Artery
 Absent DFV:    No     Reverse DFV:    No

Cervix Uterus Adnexa

 Cervical Length:    1.5      cm

 Cervix:       Measured transabdominally.
Comments

 Ms. Jevad returns for follow up.  On previous ultasound,
 asymmetric growth was noted on Twin B with mild
 polhyhdramnios and what appeared to be a prominent fetal
 stomach.  On ultrasound today, polyhydramnios is again
 noted with Twin B.  Again, a prominent fetal stomach was
 noted (4.2 x 3.5 x 1.7 cm).  A prominent stomach bubble in
 association with polyhydramnios is somewhat concerning for
 an upper GI obstruction, although the classic appearance of
 duodenal atresia is not appreciated ("double bubble sign").
 No dilated loops of bowel were noted.  The differential
 diagnosis for an enlarged stomach bubble inclused pyloric
 stenosis, membranous antral web, duodenal atresia and
 normal anatmoic variant.  The patient elected to undergo cell
 free fetal DNA ([facility]).
Impression

 DC/DA twin gestation at 29 [DATE] weeks
 A 9% inter-twin growth discordance is noted

 A: Breech, materal left, male, posterior placenta
     Normal interval anatomy
     Fetal growth is appropriate (44th %tile)
     Normal amniotic fluid volume
     Normal UA Dopplers for Gestational age
     BPP [DATE]

 B: Cephalic, maternal right, female, anterior placenta
      Estimated fetal weight at the 28th %tile.  The AC
 measures at the 7th %tile
      Mild polyhdramnios
      The fetal stomach again appears prominent (see
 comments above)
      Normal UA Dopplers for gestational age

 Cervical length (transabdominal) 1.5 cm
Recommendations

 Recommend weekly BPPs and UA Dopplers due to lagging
 AC in Twin B and polyhydramnios
 Follow up growth scan in 3 weeks

## 2013-03-16 NOTE — Progress Notes (Signed)
Brittany Cunningham  was seen today for an ultrasound appointment.  See full report in [facility].  Comments: [name] returns for follow up.  On previous ultasound, asymmetric growth was noted on Twin B with mild polhyhdramnios and what appeared to be a prominent fetal stomach.  On ultrasound today, polyhydramnios is again noted with Twin B.  Again, a prominent fetal stomach was noted (4.2 x 3.5 x 1.7 cm).  A prominent stomach bubble in association with polyhydramnios is somewhat concerning for an upper GI obstruction, although the classic appearance of duodenal atresia is not appreciated ("double bubble sign").  No dilated loops of bowel were noted.  The differential diagnosis for an enlarged stomach bubble inclused pyloric stenosis, membranous antral web, duodenal atresia and normal anatmoic variant.  The patient elected to undergo cell free fetal DNA ([facility]).  Impression: DC/DA twin gestation at 29 6/7 weeks A 9% inter-twin growth discordance is noted   A: Breech, materal left, female, posterior placenta     Normal interval anatomy     Fetal growth is appropriate (44th %tile)     Normal amniotic fluid volume     Normal UA Dopplers for Gestational age     BPP 65/8  B: Cephalic, maternal right, female, anterior placenta      Estimated fetal weight at the 28th %tile.  [facility] measures at the 7th %tile      Mild polyhdramnios      The fetal stomach again appears prominent (see comments above)      Normal UA Dopplers for gestational age  Cervical length (transabdominal) 1.5 cm  Recommendations: Recommend weekly BPPs and UA Dopplers due to lagging [facility] in Twin B and polyhydramnios Follow up growth scan in 3 weeks  Alpha Gula, MD

## 2013-03-21 ENCOUNTER — Other Ambulatory Visit (HOSPITAL_COMMUNITY): Payer: Self-pay | Admitting: Obstetrics and Gynecology

## 2013-03-21 DIAGNOSIS — O30009 Twin pregnancy, unspecified number of placenta and unspecified number of amniotic sacs, unspecified trimester: Secondary | ICD-10-CM

## 2013-03-23 ENCOUNTER — Ambulatory Visit (HOSPITAL_COMMUNITY)
Admission: RE | Admit: 2013-03-23 | Discharge: 2013-03-23 | Disposition: A | Discharge status: Home / Self Care | Payer: BC Managed Care – PPO | Source: Ambulatory Visit | Attending: Obstetrics and Gynecology | Admitting: Obstetrics and Gynecology

## 2013-03-23 DIAGNOSIS — O30009 Twin pregnancy, unspecified number of placenta and unspecified number of amniotic sacs, unspecified trimester: Secondary | ICD-10-CM | POA: Insufficient documentation

## 2013-03-23 DIAGNOSIS — O26879 Cervical shortening, unspecified trimester: Secondary | ICD-10-CM | POA: Insufficient documentation

## 2013-03-23 DIAGNOSIS — O409XX0 Polyhydramnios, unspecified trimester, not applicable or unspecified: Secondary | ICD-10-CM | POA: Insufficient documentation

## 2013-03-23 DIAGNOSIS — O365990 Maternal care for other known or suspected poor fetal growth, unspecified trimester, not applicable or unspecified: Secondary | ICD-10-CM | POA: Insufficient documentation

## 2013-03-23 DIAGNOSIS — Z8751 Personal history of pre-term labor: Secondary | ICD-10-CM | POA: Insufficient documentation

## 2013-03-23 IMAGING — US US FETAL BPP W/O NONSTRESS
1 series · 15 of 28 positions shown · non-contrast
Comparison: none

OBSTETRICS REPORT
                    (Corrected Final 03/23/2013 [DATE])

Service(s) Provided
 US FETAL BPP WITHOUT NONSTRESS                        76819.0
 US UA CORD DOPPLER                                    76820.0
 US UA ADDL GEST                                       76820.1
Indications
 Di-Di Twin pregnancy, twins discordant, antepartum
 Size less than dates (Small for gestational [AGE]
 Twin B (AC<3%)
 Polyhydramnios - Twin B
 Poor obstetric history: Previous preterm delivery
 (36 weeks)
 Cervical shortening
Fetal Evaluation (Fetus A)
 Num Of Fetuses:    2
 Fetal Heart Rate:  147                          bpm
 Cardiac Activity:  Observed
 Fetal Lie:         Lower left Fetus
 Presentation:      Breech
 Placenta:          Posterior, above cervical
                    os
 P. Cord            Previously Visualized
 Insertion:
 Membrane Desc:     Dividing
                    Membrane seen
                    - Dichorionic.
 Amniotic Fluid
 AFI FV:      Subjectively within normal limits
                                             Larg Pckt:     4.3  cm
Biophysical Evaluation (Fetus A)
 Amniotic F.V:   Within normal limits       F. Tone:        Observed
 F. Movement:    Observed                   N.S.T:          Reactive
 F. Breathing:   Observed                   Score:          [DATE]
Gestational Age (Fetus A)
 LMP:           30w 6d        Date:  08/19/12                 EDD:   05/26/13
 Best:          30w 6d     Det. By:  LMP  (08/19/12)          EDD:   05/26/13
Anatomy (Fetus A)
 Cranium:          Previously seen        Aortic Arch:      Not well visualized
 Fetal Cavum:      Previously seen        Ductal Arch:      Previously seen
 Ventricles:       Previously seen        Diaphragm:        Previously seen
 Choroid Plexus:   Previously seen        Stomach:          Appears normal, left
                                                            sided
 Cerebellum:       Previously seen        Abdomen:          Previously seen
 Posterior Fossa:  Previously seen        Abdominal Wall:   Previously seen
 Nuchal Fold:      Not applicable (>20    Cord Vessels:     Previously seen
                   wks GA)
 Face:             Orbits and profile     Kidneys:          Previously seen
                   previously seen
 Lips:             Previously seen        Bladder:          Appears normal
 Heart:            Appears normal         Spine:            Limited views
                   (4CH, axis, and                          previously seen
                   situs)
 RVOT:             Previously seen        Lower             Previously seen
                                          Extremities:
 LVOT:             Previously seen        Upper             Previously seen
 Other:  Fetus appears to be a male. Technically difficult due to advanced GA
         and fetal position.
Doppler - Fetal Vessels (Fetus A)
 Umbilical Artery
 S/D:   3.28           72  %tile       RI:
 PI:    1.15                           PSV:       38.31   cm/s
Fetal Evaluation (Fetus B)
 Fetal Heart Rate:  144                          bpm
 Fetal Lie:         Upper right Fetus
 Presentation:      Cephalic
 Placenta:          Anterior right, above
                    cervical os
 AFI FV:      Polyhydramnios
                                             Larg Pckt:     7.5  cm
Biophysical Evaluation (Fetus B)
 F. Breathing:   Not Observed               Score:          [DATE]
Gestational Age (Fetus B)
Anatomy (Fetus B)
 Cranium:          Previously seen        Aortic Arch:      Previously seen
 Face:             Appears normal         Kidneys:          Previously seen
                   (orbits and profile)
 Heart:            Appears normal         Spine:            Previously seen
                   (4CH, axis, and
 RVOT:             Appears normal         Lower             Previously seen
 Other:  Fetus appears to be a female. Technically difficult due to advanced
         GA and fetal position.
Doppler - Fetal Vessels (Fetus B)
 S/D:   2.83           53  %tile       RI:
 PI:    1.09                           PSV:       30.58   cm/s
Cervix Uterus Adnexa
 Cervix:       Not visualized (advanced GA >44wks)
Impression
INDICATION: 26 yr old [id number] at 48w5d with
 dichorionic/diamniotic twin gestation; previous preterm
 delivery, twin B with lagging abdominal circumference and
 polyhydramnios, and shortened cervical length for BPPs and
 Doppler studies.

[Series 1: us fetal bpp w/o nonstress · 0.30mm/px · 53 acquisitions, 15 frames shown]
[im 1/53]
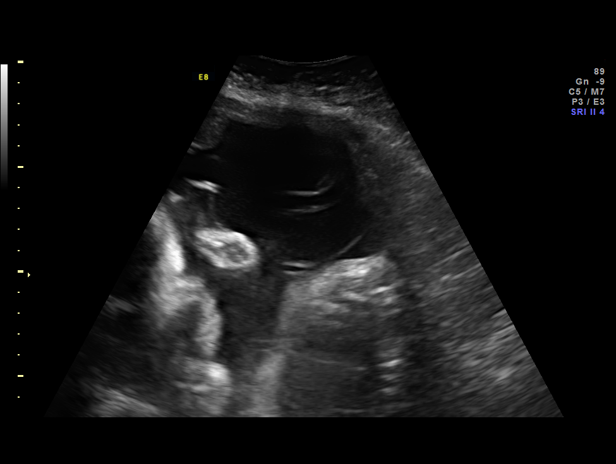
[im 4/53]
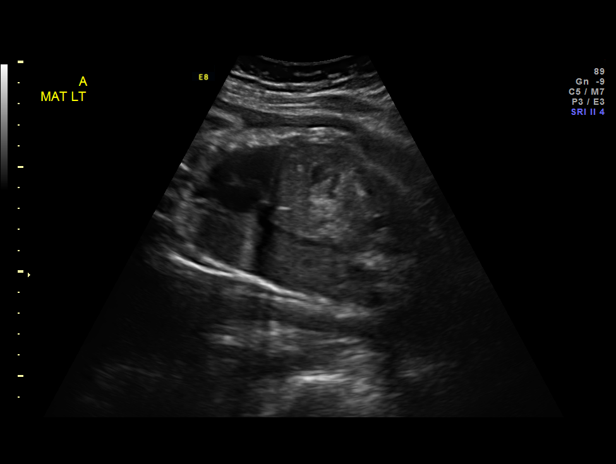
[im 8/53]
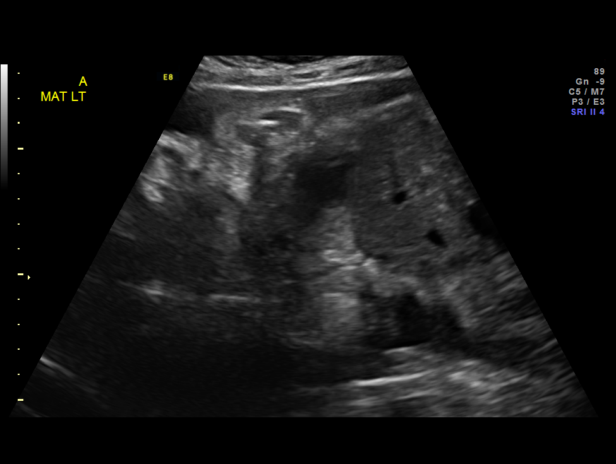
[im 12/53]
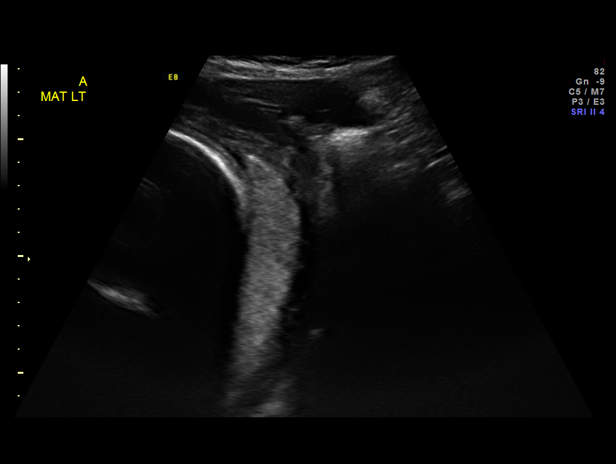
[im 16/53]
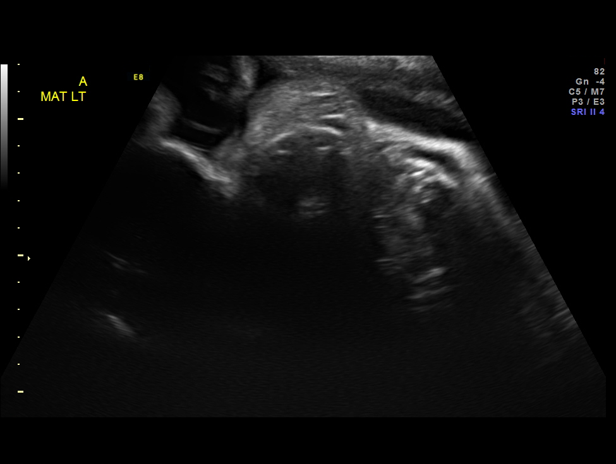
[im 20/53]
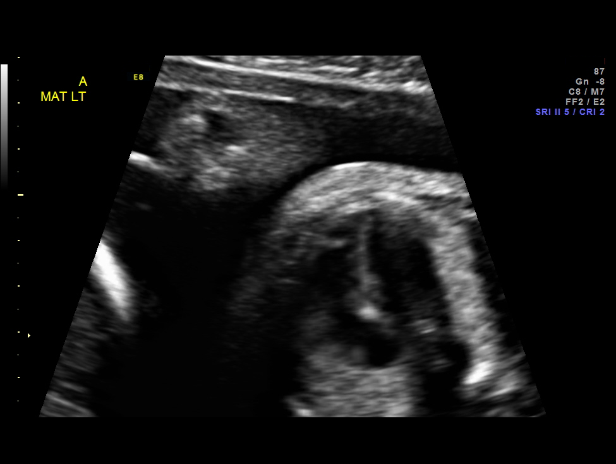
[im 24/53]
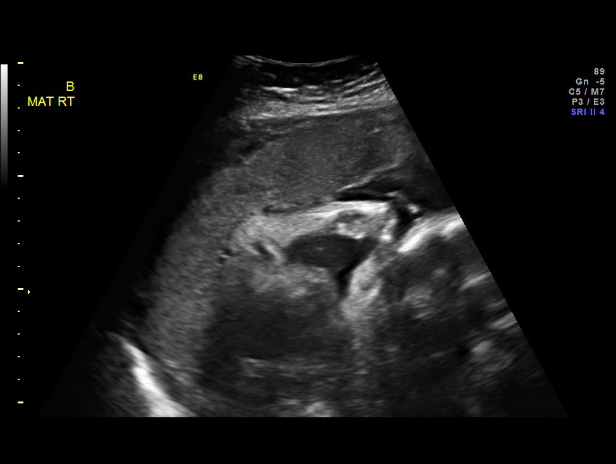
[im 27/53]
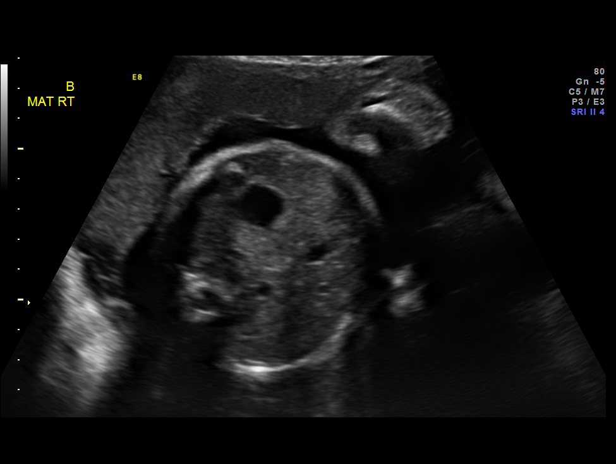
[im 29/53]
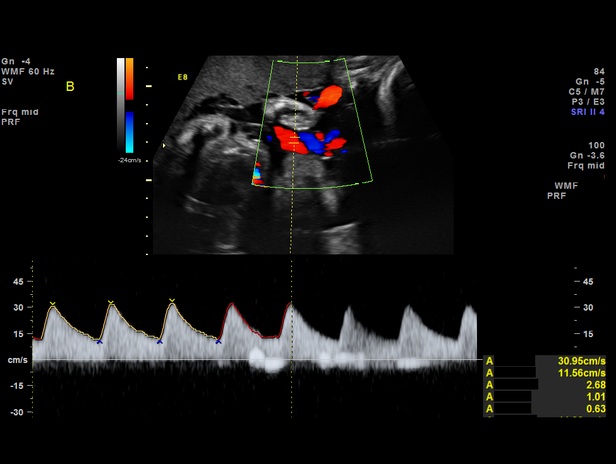
[im 33/53]
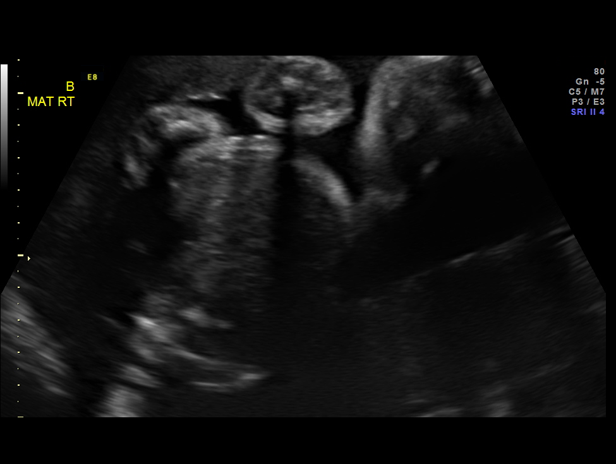
[im 37/53]
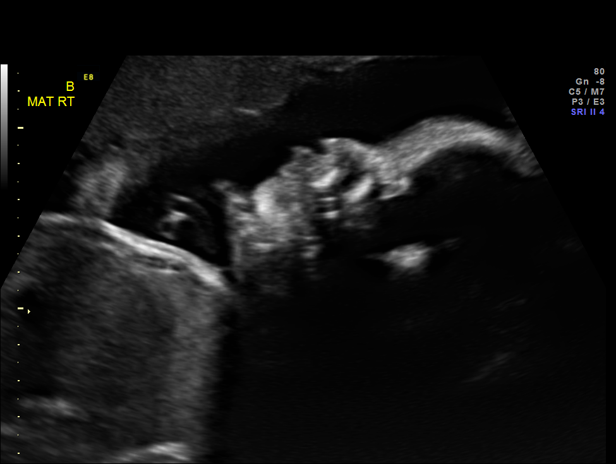
[im 41/53]
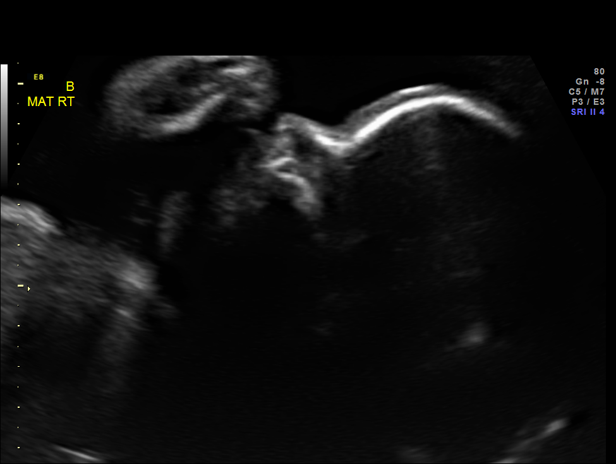
[im 45/53]
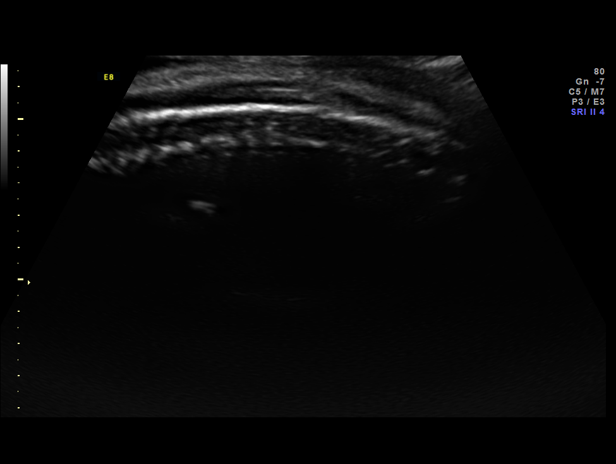
[im 49/53]
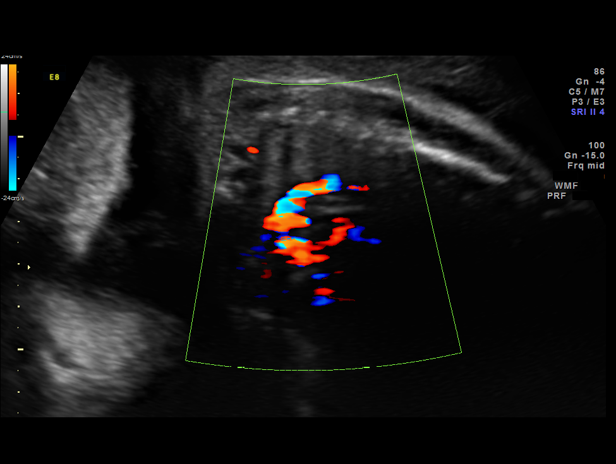
[im 53/53]
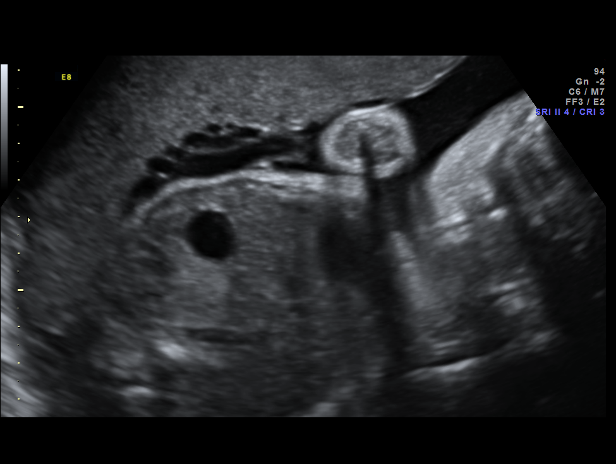

[15 of 28 positions shown; findings below may reference images not displayed]

FINDINGS: 1. Dichorionic/diamniotic twin gestation; the dividing
 membrane is seen.
 2. Twin A with a posterior placenta; twin B with an anterior
 placenta; there is no evidence of previa.
 3. Twin A is in breech presentation; twin B is in cephalic
 presentation.
 4. Normal amniotic fluid volume for both fetuses although
 maximum vertical pocket for twin B is at the upper limits of
 normal at 7.5cm.
 5. Normal biophysical profiles of [DATE] for twin A; twin B is [DATE] (-
 2 for breathing).
 6. Normal umbilical artery Doppler studies for both fetuses.
Recommendations

 1. Dichorionic twin pregnancy:
 - previously counseled
 2. Lagging abdominal circumference in twin B:
 - previously counseled
 - recommend fetal growth every 2-3 weeks; due in 2 weeks
 - recommend continue antenatal testing with weekly
 biophysical profiles and umbilical artery Doppler studies-
 reassuring today
 - overall BPPs [DATE] for twin A; [DATE] for twin B- both had
 reactive NSTs
 3. Polyhydramnios for twin B:
 - fluid at top end of normal today
 - unclear etiology
 - previously counseled
 - had cell free fetal DNA- results pending
 - had elevated 1 hour glucola and normal 3 hour gtt per
 patient
 - discussed increased risk of preterm labor/PPROM and
 stillbirth
 - recommend continue antenatal testing as above
 4. Short cervix:
 - previously counseled
 - recommend strict preterm labor/PPROM precautions
 - [place]

## 2013-03-23 NOTE — Progress Notes (Signed)
[facility] ultrasound  Indication: 27 yr old [id number] at [redacted]w[redacted]d with dichorionic/diamniotic twin gestation; previous preterm delivery, twin B with lagging abdominal circumference and polyhydramnios, and shortened cervical length for BPPs and Doppler studies.  Findings: 1. Dichorionic/diamniotic twin gestation; the dividing membrane is seen. 2. Twin A with a posterior placenta; twin B with an anterior placenta; there is no evidence of previa. 3. Twin A is in breech presentation; twin B is in cephalic presentation. 4. Normal amniotic fluid volume for both fetuses although maximum vertical pocket for twin B is at the upper limits of normal at 7.5cm. 5. Normal biophysical profiles of 8/8 for twin A; twin B is 6/8 (-2 for breathing). 6. Normal umbilical artery Doppler studies for both fetuses.  Recommendations: 1. Dichorionic twin pregnancy: - previously counseled 2. Lagging abdominal circumference in twin B: - previously counseled - recommend fetal growth every 2-3 weeks; due in 2 weeks - recommend continue antenatal testing with weekly biophysical profiles and umbilical artery Doppler studies- reassuring today -- overall BPPs 10/10 for twin A; 8/10 for twin B- both had reactive NSTs 3. Polyhydramnios for twin B: - fluid at top end of normal today - unclear etiology - previously counseled - had cell free fetal DNA- results pending - had elevated 1 hour glucola and normal 3 hour gtt per patient - discussed increased risk of preterm labor/PPROM and stillbirth - recommend continue antenatal testing as above  4. Short cervix: - previously counseled - recommend strict preterm labor/PPROM precautions - s/p betamethasone   Eulis Foster, MD

## 2013-03-25 ENCOUNTER — Other Ambulatory Visit (HOSPITAL_COMMUNITY): Payer: Self-pay | Admitting: Obstetrics and Gynecology

## 2013-03-25 DIAGNOSIS — O30009 Twin pregnancy, unspecified number of placenta and unspecified number of amniotic sacs, unspecified trimester: Secondary | ICD-10-CM

## 2013-03-29 ENCOUNTER — Telehealth (HOSPITAL_COMMUNITY): Payer: Self-pay | Admitting: MS"

## 2013-03-29 NOTE — Telephone Encounter (Signed)
Attempted to call patient regarding normal cell free fetal DNA testing results. Left message for patient to return call.   Brittany Cunningham 03/29/2013 1:45 PM

## 2013-03-29 NOTE — Telephone Encounter (Signed)
Called Brittany Cunningham to discuss her cell free fetal DNA test results.  Brittany Cunningham had [facility] testing through [facility].  Testing was offered because of abnormal ultrasound findings.   The patient was identified by name and DOB.  We reviewed that these are within normal limits, showing a less than 1 in 10,000 risk for trisomies 21, 18 and 13.  We reviewed that this testing identifies approximately 90% of twin gestations with trisomy 64.  The false positive rate is <0.1% for all conditions. She understands that this testing does not identify all genetic conditions.  All questions were answered to her satisfaction, she was encouraged to call with additional questions or concerns.  Quinn Plowman, MS Certified Genetic Counselor 03/29/2013 2:50 PM

## 2013-03-30 ENCOUNTER — Encounter (HOSPITAL_COMMUNITY): Payer: Self-pay

## 2013-03-30 ENCOUNTER — Ambulatory Visit (HOSPITAL_COMMUNITY)
Admission: RE | Admit: 2013-03-30 | Discharge: 2013-03-30 | Disposition: A | Discharge status: Home / Self Care | Payer: BC Managed Care – PPO | Source: Ambulatory Visit | Attending: Obstetrics and Gynecology | Admitting: Obstetrics and Gynecology

## 2013-03-30 VITALS — BP 121/73 | HR 106 | Wt 220.0 lb

## 2013-03-30 DIAGNOSIS — O30009 Twin pregnancy, unspecified number of placenta and unspecified number of amniotic sacs, unspecified trimester: Secondary | ICD-10-CM | POA: Insufficient documentation

## 2013-03-30 DIAGNOSIS — O365990 Maternal care for other known or suspected poor fetal growth, unspecified trimester, not applicable or unspecified: Secondary | ICD-10-CM | POA: Insufficient documentation

## 2013-03-30 DIAGNOSIS — O26879 Cervical shortening, unspecified trimester: Secondary | ICD-10-CM | POA: Insufficient documentation

## 2013-03-30 DIAGNOSIS — O409XX0 Polyhydramnios, unspecified trimester, not applicable or unspecified: Secondary | ICD-10-CM | POA: Insufficient documentation

## 2013-03-30 DIAGNOSIS — Z8751 Personal history of pre-term labor: Secondary | ICD-10-CM | POA: Insufficient documentation

## 2013-03-30 IMAGING — US US UA CORD DOPPLER
1 series · 12 of 26 positions shown · non-contrast
Comparison: none

[Series 1: us ua cord doppler · 0.23mm/px · 12 of 26 slices shown]
[im 2/26]
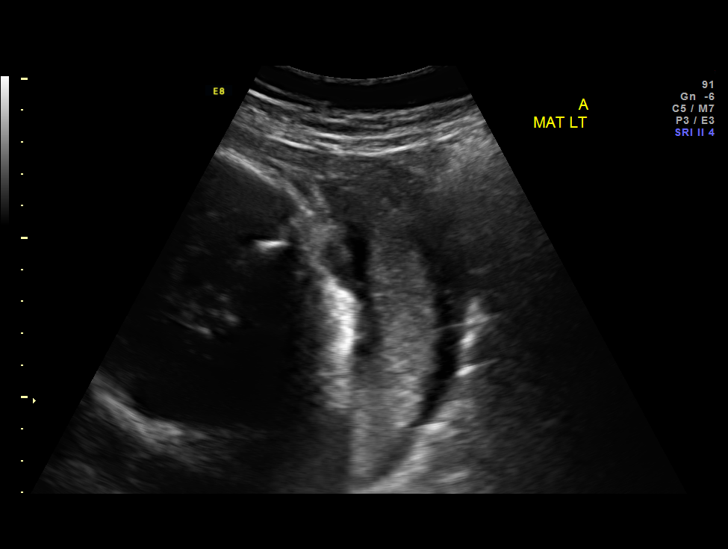
[im 4/26]
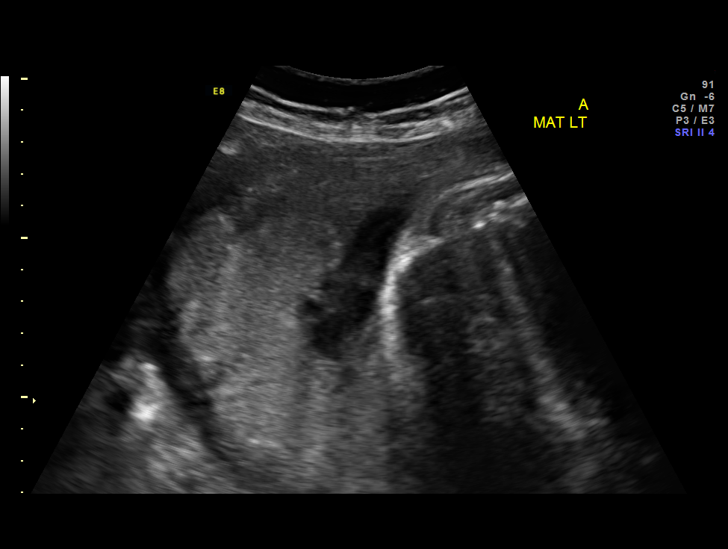
[im 6/26]
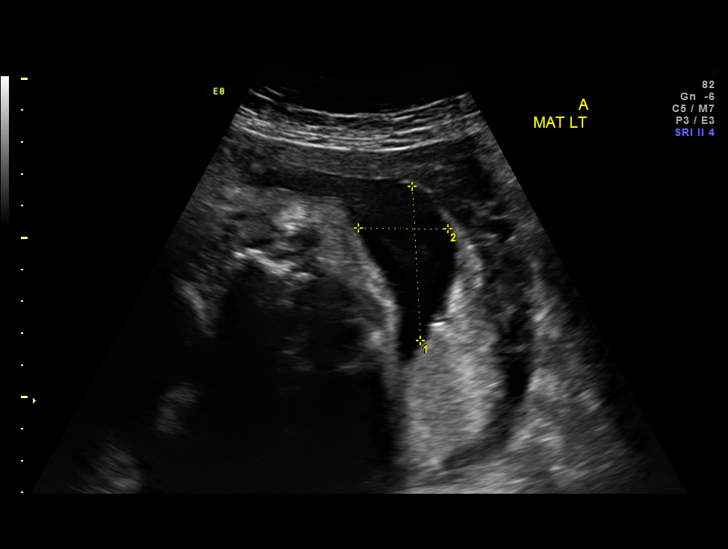
[im 8/26]
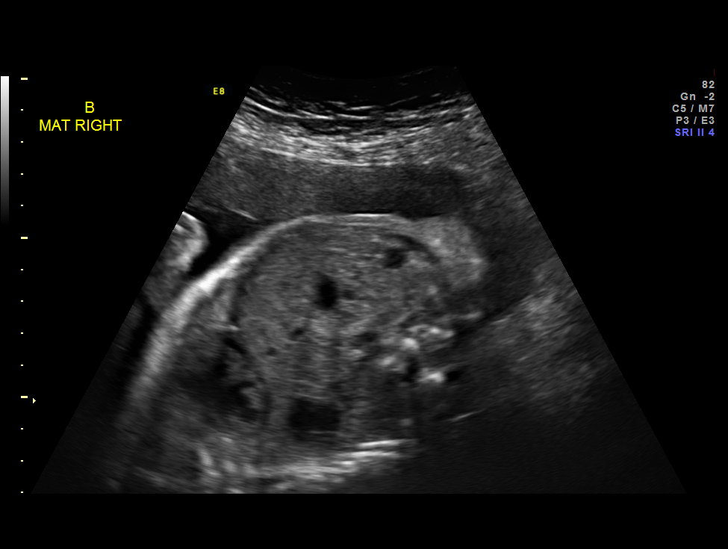
[im 10/26]
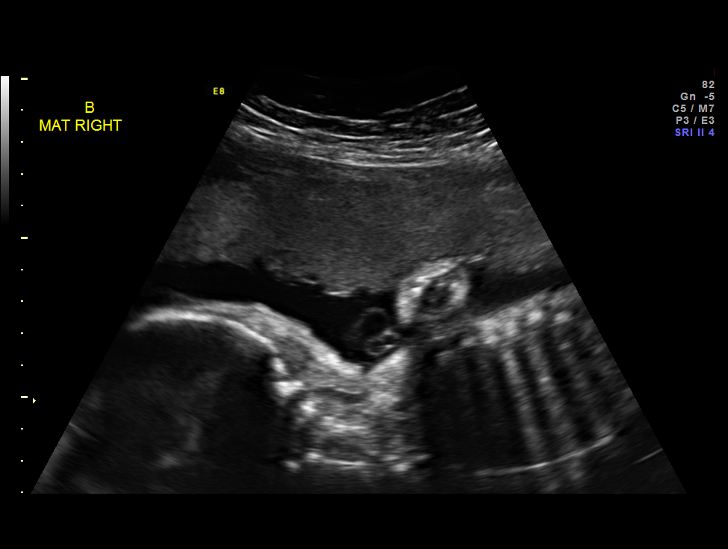
[im 12/26]
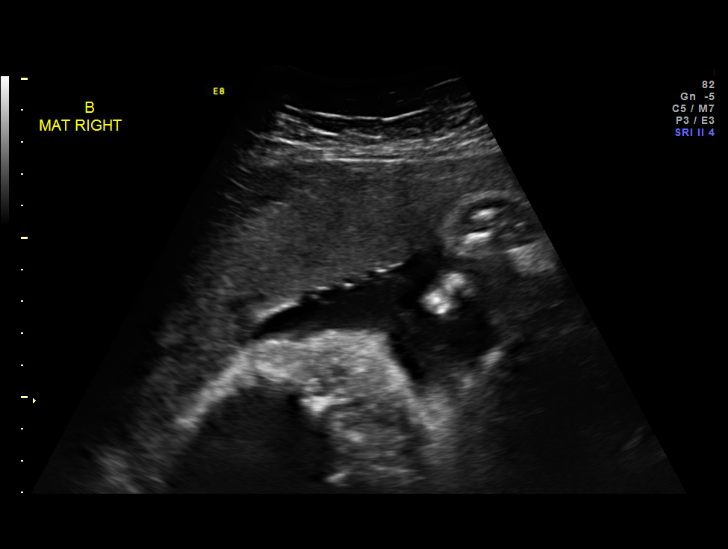
[im 15/26]
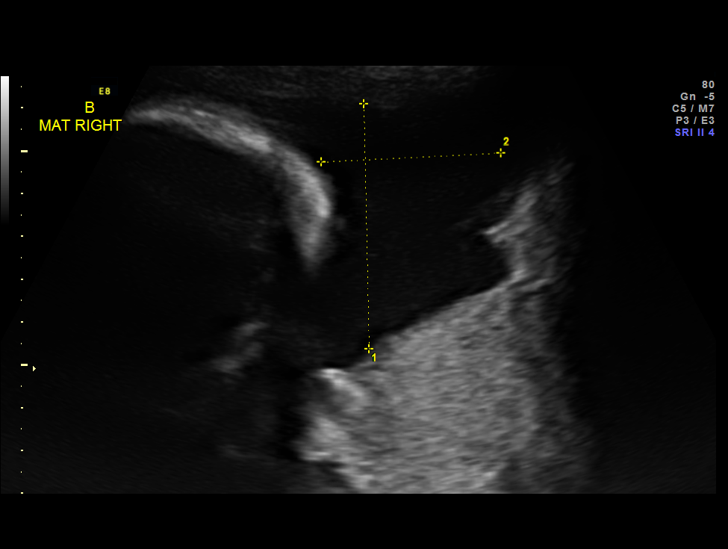
[im 17/26]
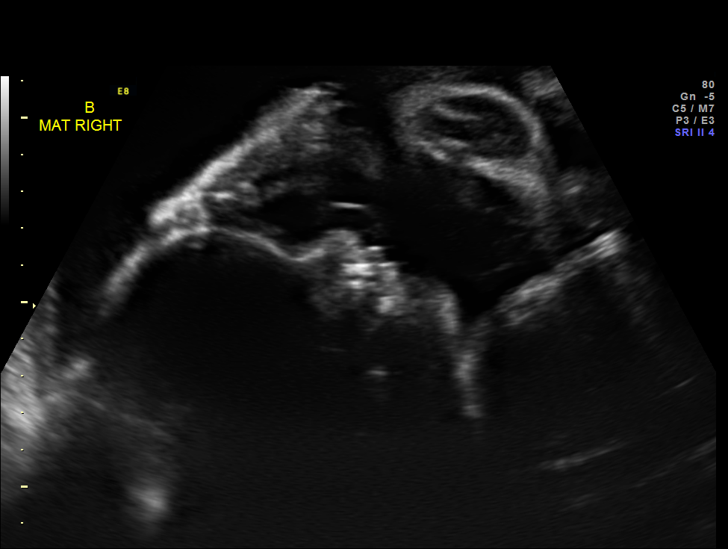
[im 19/26]
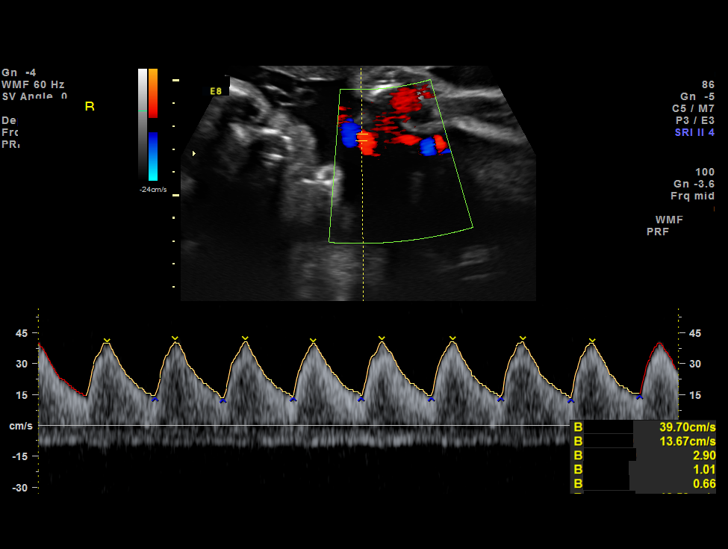
[im 21/26]
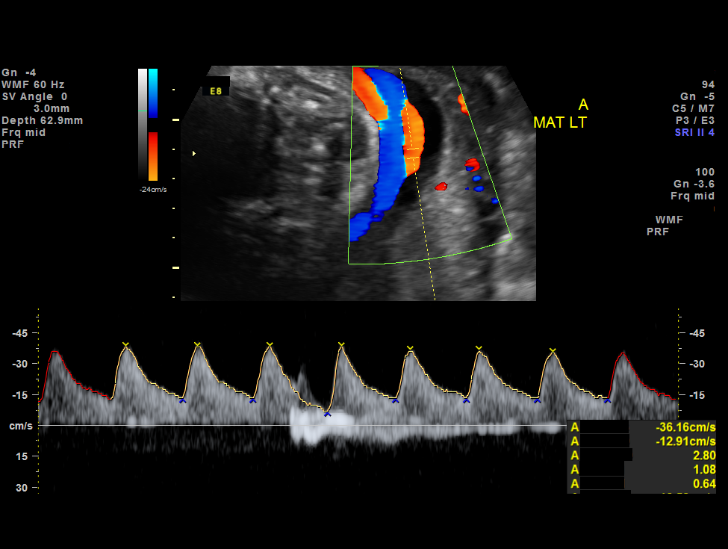
[im 23/26]
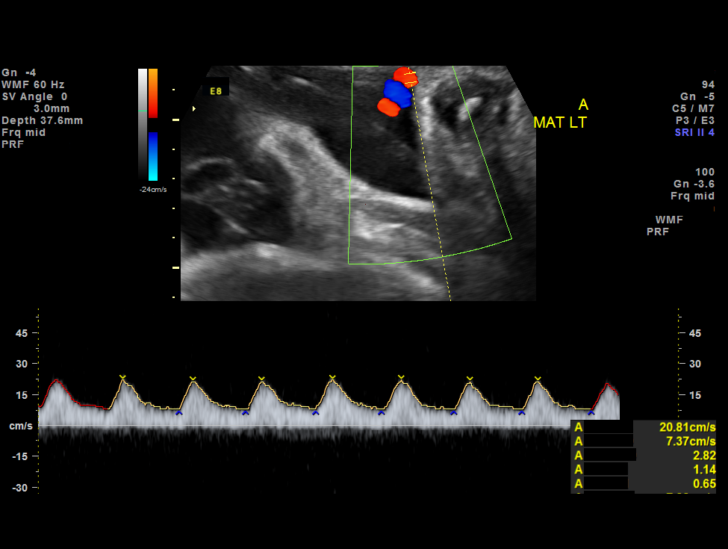
[im 25/26]
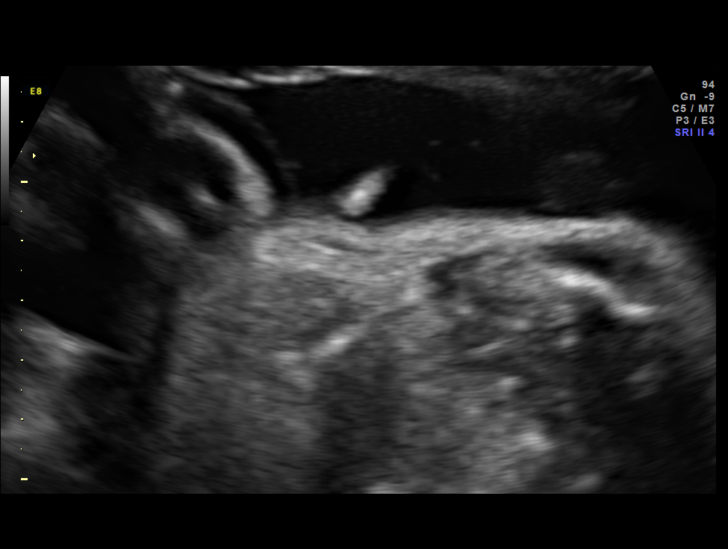

[12 of 26 positions shown; findings below may reference images not displayed]

OBSTETRICS REPORT
                      (Signed Final 03/30/2013 [DATE])

Service(s) Provided

 US UA CORD DOPPLER                                    76820.0
 US UA ADDL GEST                                       76820.1
Indications

 Di-Di Twin pregnancy, twins discordant, antepartum
 Size less than dates (Small for gestational [AGE]
 Twin B (AC<3%)
 Polyhydramnios - Twin B
 Poor obstetric history: Previous preterm delivery
 (36 weeks)
 Cervical shortening
Fetal Evaluation (Fetus A)

 Num Of Fetuses:    2
 Fetal Heart Rate:  132                          bpm
 Cardiac Activity:  Observed
 Fetal Lie:         Lower left Fetus
 Presentation:      Breech
 Placenta:          Posterior, above cervical
                    os
 P. Cord            Previously Visualized
 Insertion:

 Membrane Desc:     Dividing
                    Membrane seen
                    - Dichorionic.

 Amniotic Fluid
 AFI FV:      Subjectively within normal limits
                                             Larg Pckt:     4.8  cm
Biophysical Evaluation (Fetus A)

 Amniotic F.V:   Within normal limits       F. Tone:        Observed
 F. Movement:    Observed                   Score:          [DATE]
 F. Breathing:   Observed
Gestational Age (Fetus A)
 LMP:           31w 6d        Date:  08/19/12                 EDD:   05/26/13
 Best:          31w 6d     Det. By:  LMP  (08/19/12)          EDD:   05/26/13
Doppler - Fetal Vessels (Fetus A)

 Umbilical Artery
 S/D:   2.82           56  %tile       RI:
 PI:    1.09                           PSV:       37.87   cm/s

Fetal Evaluation (Fetus B)

 Num Of Fetuses:    2
 Fetal Heart Rate:  134                          bpm
 Cardiac Activity:  Observed
 Fetal Lie:         Upper right Fetus
 Presentation:      Breech
 Placenta:          Anterior right, above
                    cervical os
 P. Cord            Previously Visualized
 Insertion:

 Membrane Desc:     Dividing
                    Membrane seen
                    - Dichorionic.

 Amniotic Fluid
 AFI FV:      Subjectively within normal limits
                                             Larg Pckt:     5.7  cm
Biophysical Evaluation (Fetus B)

 Amniotic F.V:   Within normal limits       F. Tone:        Observed
 F. Movement:    Observed                   Score:          [DATE]
 F. Breathing:   Observed
Gestational Age (Fetus B)

 LMP:           31w 6d        Date:  08/19/12                 EDD:   05/26/13
 Best:          31w 6d     Det. By:  LMP  (08/19/12)          EDD:   05/26/13
Doppler - Fetal Vessels (Fetus B)

 Umbilical Artery
 S/D:   2.86           58  %tile       RI:
 PI:    1.03                           PSV:       39.7    cm/s

Cervix Uterus Adnexa

 Cervix:       Not visualized (advanced GA >74wks)
Impression

 DC/DA twins at 31 [DATE] weeks
 Follow up due to lagging growth and prominent stomach
 bubble / polyhydramnios of Twin B - normal cell free fetal DNA
 BPP [DATE] x 2
 Normal UA Doppler studies for gestational age with both twins
 Normal amniotic fluid volume noted in both twins
Recommendations

 Recommend follow up BPP, UA Dopplers and Growth next
 week.
 Plan 2x weekly NSTs with weekly UA Dopplers/AFI thereafter

## 2013-03-30 NOTE — Progress Notes (Signed)
Mercedies Gasior  was seen today for an ultrasound appointment.  See full report in [facility].  Impression: DC/DA twins at 13 6/7 weeks Follow up due to lagging growth and prominent stomach bubble / polyhydramnios of Twin B - normal cell free fetal DNA BPP 8/8 x 2 Normal UA Doppler studies for gestational age with both twins Normal amniotic fluid volume noted in both twins  Recommendations: Recommend follow up BPP, UA Dopplers and Growth next week. Plan 2x weekly NSTs with weekly UA Dopplers/AFI thereafter  Alpha Gula, MD

## 2013-04-06 ENCOUNTER — Ambulatory Visit (HOSPITAL_COMMUNITY)
Admission: RE | Admit: 2013-04-06 | Discharge: 2013-04-06 | Disposition: A | Discharge status: Home / Self Care | Payer: BC Managed Care – PPO | Source: Ambulatory Visit | Attending: Obstetrics and Gynecology | Admitting: Obstetrics and Gynecology

## 2013-04-06 ENCOUNTER — Other Ambulatory Visit (HOSPITAL_COMMUNITY): Payer: Self-pay | Admitting: Maternal and Fetal Medicine

## 2013-04-06 DIAGNOSIS — O30009 Twin pregnancy, unspecified number of placenta and unspecified number of amniotic sacs, unspecified trimester: Secondary | ICD-10-CM

## 2013-04-06 DIAGNOSIS — O409XX0 Polyhydramnios, unspecified trimester, not applicable or unspecified: Secondary | ICD-10-CM | POA: Insufficient documentation

## 2013-04-06 DIAGNOSIS — Z8751 Personal history of pre-term labor: Secondary | ICD-10-CM | POA: Insufficient documentation

## 2013-04-06 DIAGNOSIS — O365990 Maternal care for other known or suspected poor fetal growth, unspecified trimester, not applicable or unspecified: Secondary | ICD-10-CM | POA: Insufficient documentation

## 2013-04-06 DIAGNOSIS — O26879 Cervical shortening, unspecified trimester: Secondary | ICD-10-CM | POA: Insufficient documentation

## 2013-04-06 IMAGING — US US FETAL BPP W/O NONSTRESS
1 series · 14 of 28 positions shown · non-contrast
Comparison: none

[Series 1: us fetal bpp w/o nonstress · 14 of 51 slices shown]
[im 2/51]
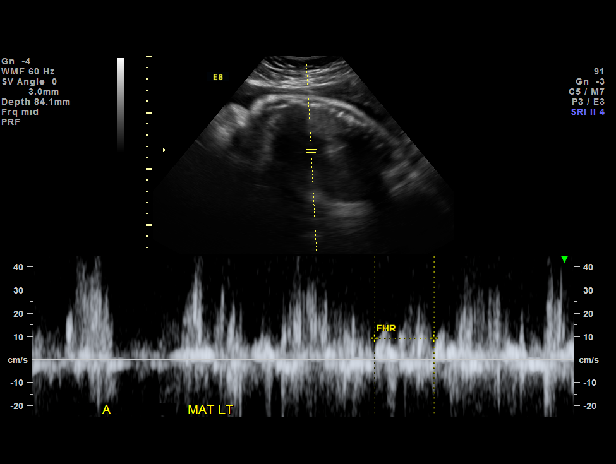
[im 6/51]
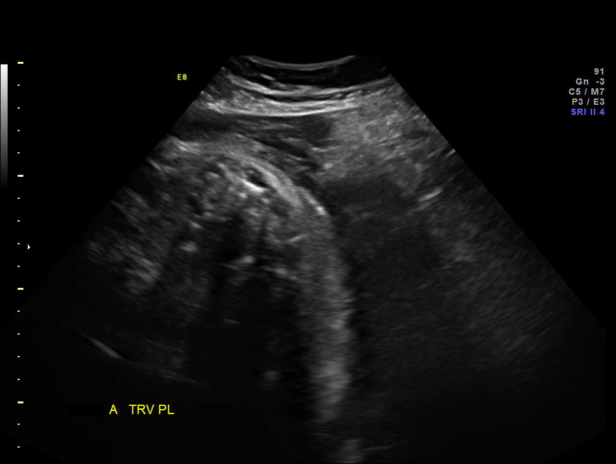
[im 10/51]
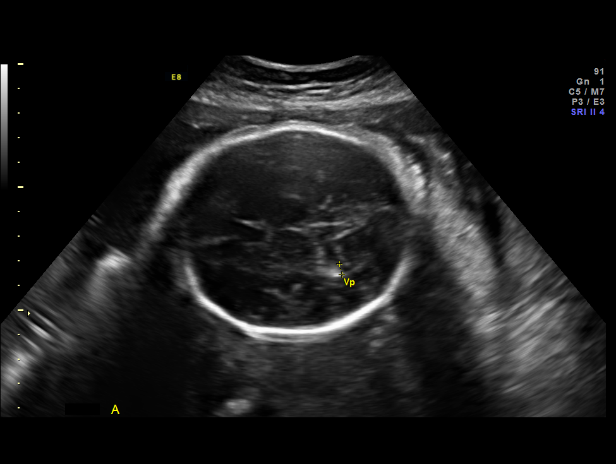
[im 13/51]
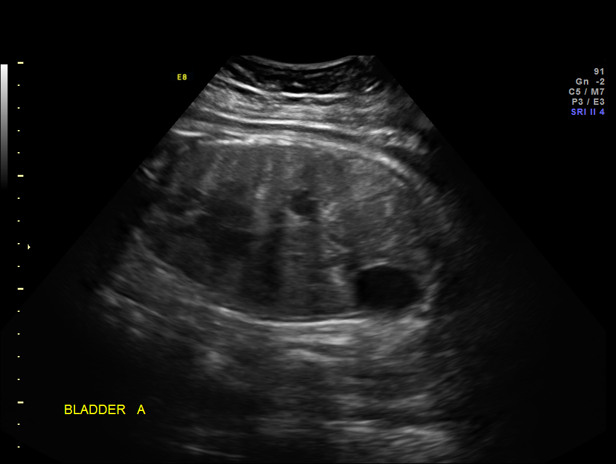
[im 17/51]
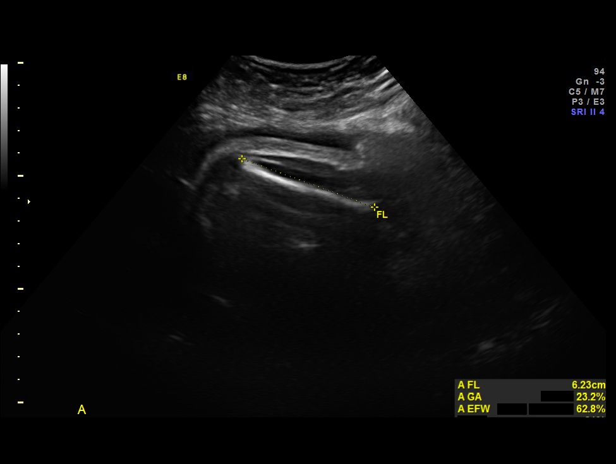
[im 21/51]
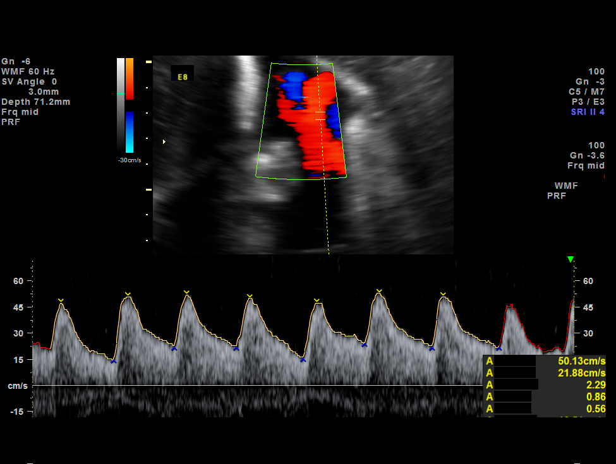
[im 25/51]
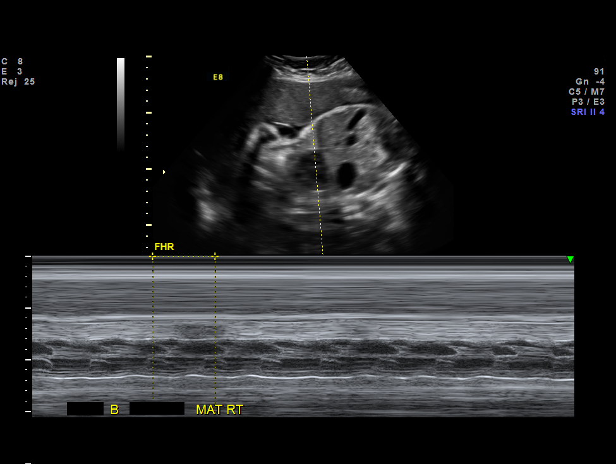
[im 28/51]
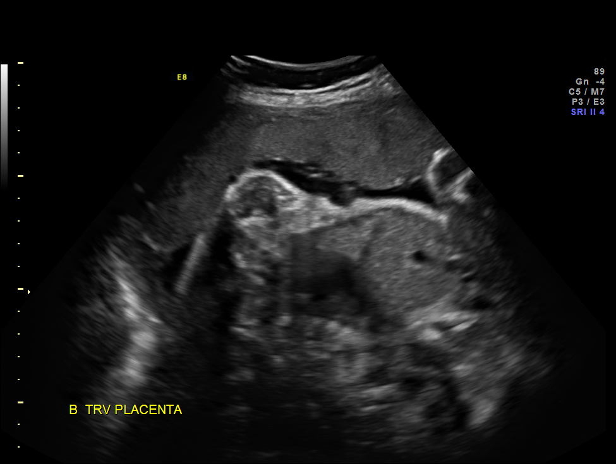
[im 32/51]
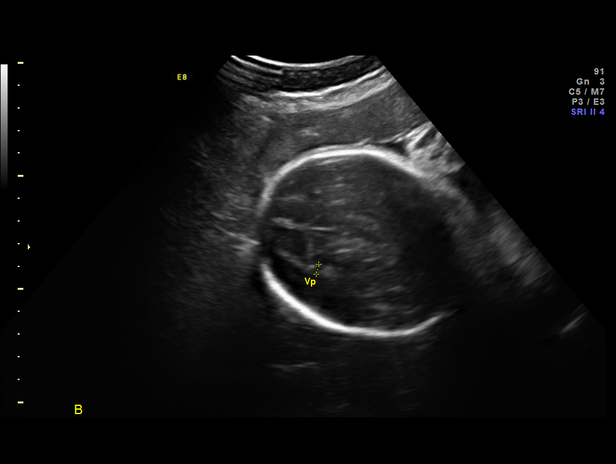
[im 36/51]
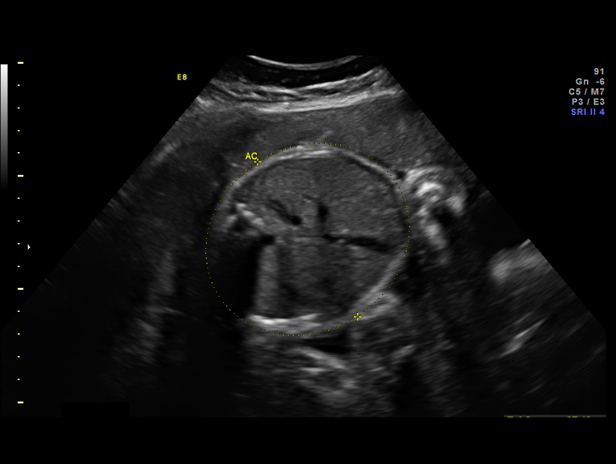
[im 39/51]
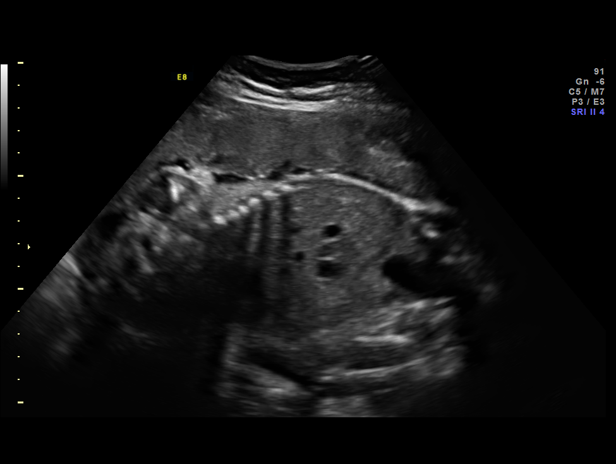
[im 43/51]
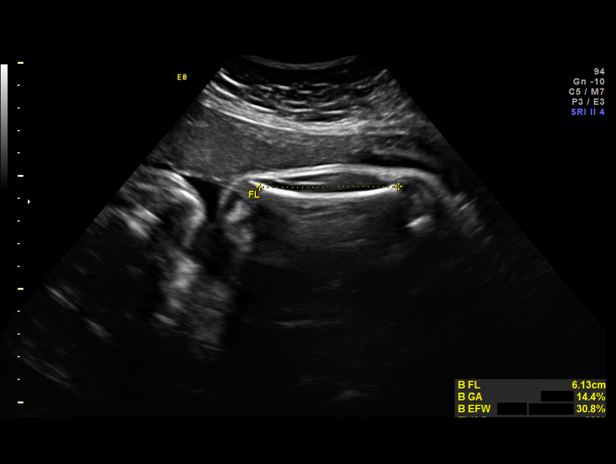
[im 47/51]
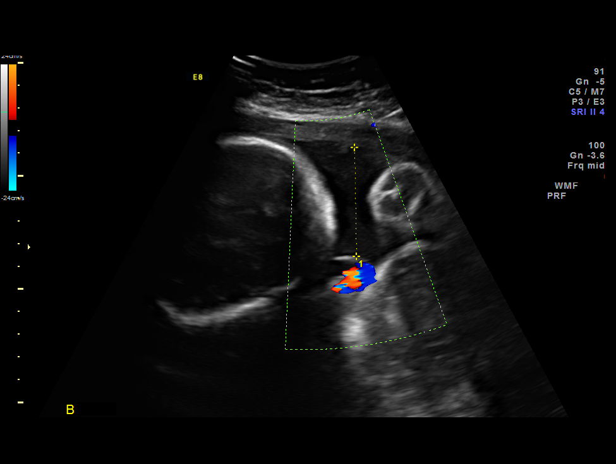
[im 51/51]
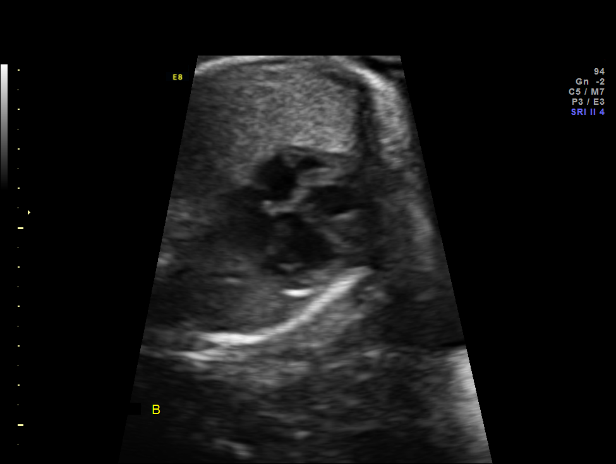

[14 of 28 positions shown; findings below may reference images not displayed]

OBSTETRICS REPORT
                    (Corrected Final 04/07/2013 [DATE])

Service(s) Provided

 US OB FOLLOW UP                                       76816.1
 US OB FOLLOW UP ADDL GEST                             76816.2
 US UA CORD DOPPLER                                    76820.0
 US UA DOPPLER ADDL GEST RE EVAL                       76828.2
Indications

 Di-Di Twin pregnancy, twins discordant, antepartum
 Size less than dates (Small for gestational [AGE]
 Twin B (AC<3%)
 Polyhydramnios - Twin B
 Poor obstetric history: Previous preterm delivery
 (36 weeks)
 Cervical shortening
Fetal Evaluation (Fetus A)

 Num Of Fetuses:    2
 Fetal Heart Rate:  144                          bpm
 Cardiac Activity:  Observed
 Fetal Lie:         Maternal left side
 Presentation:      Breech
 Placenta:          Posterior, above cervical
                    os
 P. Cord            Previously Visualized
 Insertion:

 Membrane Desc:     Dividing
                    Membrane seen

 Amniotic Fluid
 AFI FV:      Subjectively within normal limits
                                             Larg Pckt:     4.7  cm
Biophysical Evaluation (Fetus A)

 Amniotic F.V:   Pocket => 2 cm two         F. Tone:        Observed
                 planes
 F. Movement:    Observed                   Score:          [DATE]
 F. Breathing:   Observed
Biometry (Fetus A)

 BPD:     82.3  mm     G. Age:  33w 1d                CI:         75.7   70 - 86
 OFD:    108.7  mm                                    FL/HC:      20.1   19.9 -

 HC:       308  mm     G. Age:  34w 3d       52  %    HC/AC:      1.04   0.96 -

 AC:     296.4  mm     G. Age:  33w 4d       72  %    FL/BPD:     75.2   71 - 87
 FL:      61.9  mm     G. Age:  32w 0d       20  %    FL/AC:      20.9   20 - 24

 Est. FW:    4011  gm    4 lb 12 oz      65  %     FW Discordancy     0 \ 16 %
Gestational Age (Fetus A)

 LMP:           32w 6d        Date:  08/19/12                 EDD:   05/26/13
 U/S Today:     33w 2d                                        EDD:   05/23/13
 Best:          32w 6d     Det. By:  LMP  (08/19/12)          EDD:   05/26/13
Anatomy (Fetus A)

 Cranium:          Previously seen        Aortic Arch:      Not well visualized
 Fetal Cavum:      Previously seen        Ductal Arch:      Previously seen
 Ventricles:       Appears normal         Diaphragm:        Appears normal
 Choroid Plexus:   Previously seen        Stomach:          Appears normal, left
                                                            sided
 Cerebellum:       Previously seen        Abdomen:          Previously seen
 Posterior Fossa:  Previously seen        Abdominal Wall:   Previously seen
 Nuchal Fold:      Not applicable (>20    Cord Vessels:     Previously seen
                   wks GA)
 Face:             Orbits and profile     Kidneys:          Appear normal
                   previously seen
 Lips:             Previously seen        Bladder:          Appears normal
 Heart:            Appears normal         Spine:            Limited views
                   (4CH, axis, and                          previously seen
                   situs)
 RVOT:             Previously seen        Lower             Previously seen
                                          Extremities:
 LVOT:             Previously seen        Upper             Previously seen
                                          Extremities:

 Other:  Male gender previously seen.
Targeted Anatomy (Fetus A)

 Fetal Central Nervous System
 Lat. Ventricles:
Doppler - Fetal Vessels (Fetus A)

 Umbilical Artery
 S/D:   2.48           42  %tile       RI:
 PI:    0.95                           PSV:       50.13   cm/s

Fetal Evaluation (Fetus B)

 Num Of Fetuses:    2
 Fetal Heart Rate:  136                          bpm
 Cardiac Activity:  Observed
 Fetal Lie:         Maternal right side
 Presentation:      Breech
 Placenta:          Anterior, right above
                    cervical os

 Membrane Desc:     Dividing
                    Membrane seen

 Amniotic Fluid
 AFI FV:      Subjectively within normal limits
                                             Larg Pckt:     4.8  cm
Biophysical Evaluation (Fetus B)

 Amniotic F.V:   Pocket => 2 cm two         F. Tone:        Observed
                 planes
 F. Movement:    Observed                   Score:          [DATE]
 F. Breathing:   Observed
Biometry (Fetus B)

 BPD:     78.5  mm     G. Age:  31w 4d                CI:         75.5   70 - 86
 OFD:      104  mm                                    FL/HC:      21.1   19.9 -

 HC:     293.5  mm     G. Age:  32w 3d        9  %    HC/AC:      1.09   0.96 -

 AC:     270.1  mm     G. Age:  31w 1d        9  %    FL/BPD:     79.0   71 - 87
 FL:        62  mm     G. Age:  32w 1d       21  %    FL/AC:      23.0   20 - 24

 Est. FW:    9245  gm    3 lb 15 oz      34  %     FW Discordancy        16  %
Gestational Age (Fetus B)

 LMP:           32w 6d        Date:  08/19/12                 EDD:   05/26/13
 U/S Today:     31w 6d                                        EDD:   06/02/13
 Best:          32w 6d     Det. By:  LMP  (08/19/12)          EDD:   05/26/13
Anatomy (Fetus B)

 Cranium:          Previously seen        Aortic Arch:      Previously seen
 Fetal Cavum:      Previously seen        Ductal Arch:      Previously seen
 Ventricles:       Appears normal         Diaphragm:        Appears normal
 Choroid Plexus:   Previously seen        Stomach:          Appears normal, left
                                                            sided
 Cerebellum:       Previously seen        Abdomen:          Previously seen
 Posterior Fossa:  Previously seen        Abdominal Wall:   Previously seen
 Nuchal Fold:      Not applicable (>20    Cord Vessels:     Previously seen
                   wks GA)
 Face:             Orbits and profile     Kidneys:          Appear normal
                   previously seen
 Lips:             Previously seen        Bladder:          Appears normal
 Heart:            Appears normal         Spine:            Previously seen
                   (4CH, axis, and
                   situs)
 RVOT:             Previously seen        Lower             Previously seen
                                          Extremities:
 LVOT:             Previously seen        Upper             Previously seen
                                          Extremities:

 Other:  Female gender previously seen.
Targeted Anatomy (Fetus B)
 Fetal Central Nervous System
 Lat. Ventricles:
Doppler - Fetal Vessels (Fetus B)

 Umbilical Artery
 S/D:   2.65           52  %tile       RI:
 PI:    1.09                           PSV:       38.24   cm/s

Impression

 DC/DA twins at 32 [DATE] weeks for follow up due to lagging
 growth and prominent stomach bubble / polyhydramnios of
 Twin B - normal cell free fetal DNA
 Technically concordant growth (16% discordance today)
 Concordant fluid
 BPP [DATE] x 2
 Normal UA Doppler studies for gestational age with both twins
Recommendations

 Continue antenatal testing and Dopplers as previously
 scheduled.

                 Attending Physician, JUMPER

## 2013-04-11 ENCOUNTER — Other Ambulatory Visit (HOSPITAL_COMMUNITY): Payer: 59

## 2013-04-12 ENCOUNTER — Other Ambulatory Visit (HOSPITAL_COMMUNITY): Payer: Self-pay | Admitting: Obstetrics and Gynecology

## 2013-04-12 DIAGNOSIS — O30009 Twin pregnancy, unspecified number of placenta and unspecified number of amniotic sacs, unspecified trimester: Secondary | ICD-10-CM

## 2013-04-13 ENCOUNTER — Ambulatory Visit (HOSPITAL_COMMUNITY)
Admission: RE | Admit: 2013-04-13 | Discharge: 2013-04-13 | Disposition: A | Discharge status: Home / Self Care | Payer: BC Managed Care – PPO | Source: Ambulatory Visit | Attending: Obstetrics and Gynecology | Admitting: Obstetrics and Gynecology

## 2013-04-13 ENCOUNTER — Encounter (HOSPITAL_COMMUNITY): Payer: Self-pay

## 2013-04-13 ENCOUNTER — Other Ambulatory Visit (HOSPITAL_COMMUNITY): Payer: Self-pay | Admitting: Obstetrics and Gynecology

## 2013-04-13 VITALS — BP 124/79 | HR 113 | Wt 226.0 lb

## 2013-04-13 DIAGNOSIS — O409XX0 Polyhydramnios, unspecified trimester, not applicable or unspecified: Secondary | ICD-10-CM | POA: Insufficient documentation

## 2013-04-13 DIAGNOSIS — O365990 Maternal care for other known or suspected poor fetal growth, unspecified trimester, not applicable or unspecified: Secondary | ICD-10-CM | POA: Insufficient documentation

## 2013-04-13 DIAGNOSIS — Z8751 Personal history of pre-term labor: Secondary | ICD-10-CM | POA: Insufficient documentation

## 2013-04-13 DIAGNOSIS — O30009 Twin pregnancy, unspecified number of placenta and unspecified number of amniotic sacs, unspecified trimester: Secondary | ICD-10-CM

## 2013-04-13 DIAGNOSIS — O26879 Cervical shortening, unspecified trimester: Secondary | ICD-10-CM | POA: Insufficient documentation

## 2013-04-13 IMAGING — US US UA ADDL GEST
1 series · 12 of 16 positions shown · non-contrast
Comparison: none

[Series 1: us ua addl gest · 0.26mm/px · 22 acquisitions, 12 frames shown]
[im 1/22]
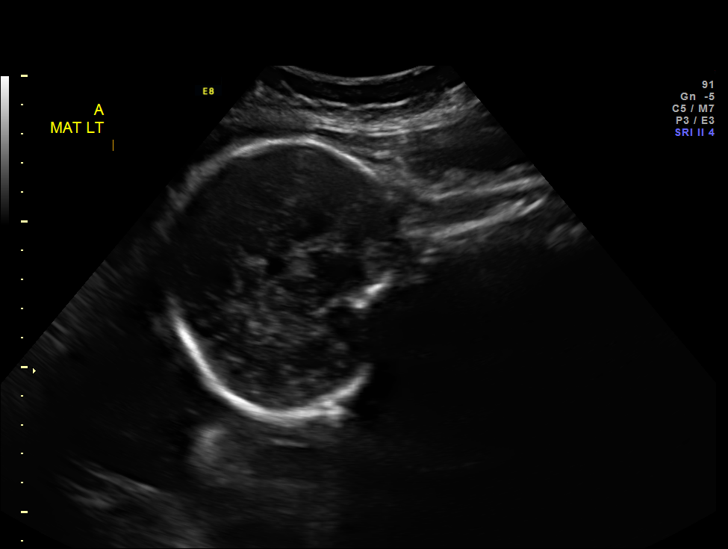
[im 3/22]
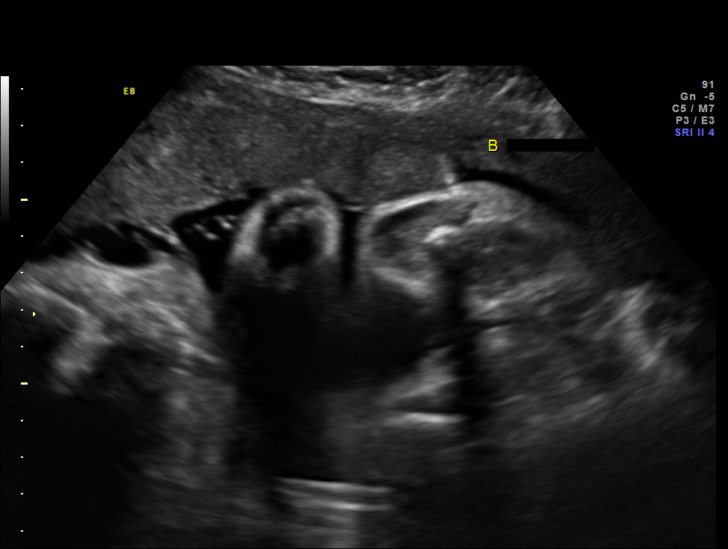
[im 5/22]
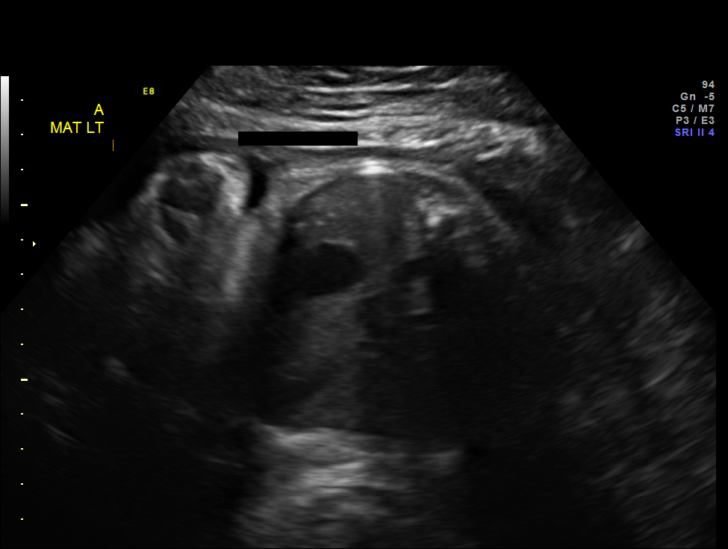
[im 6/22]
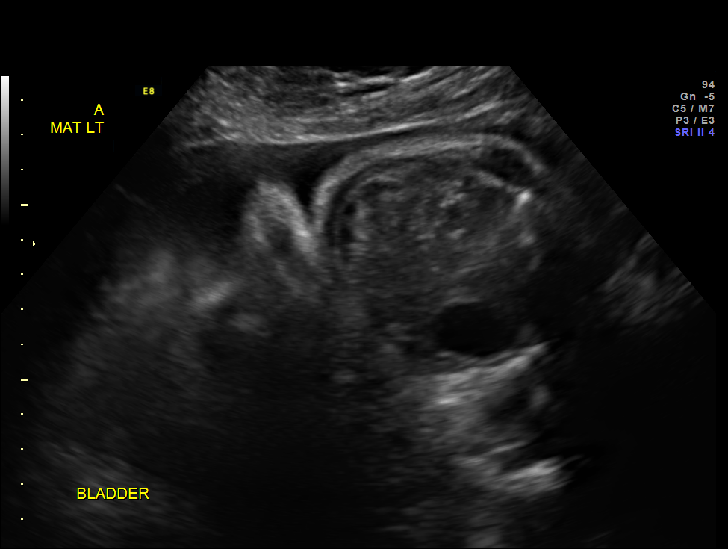
[im 9/22]
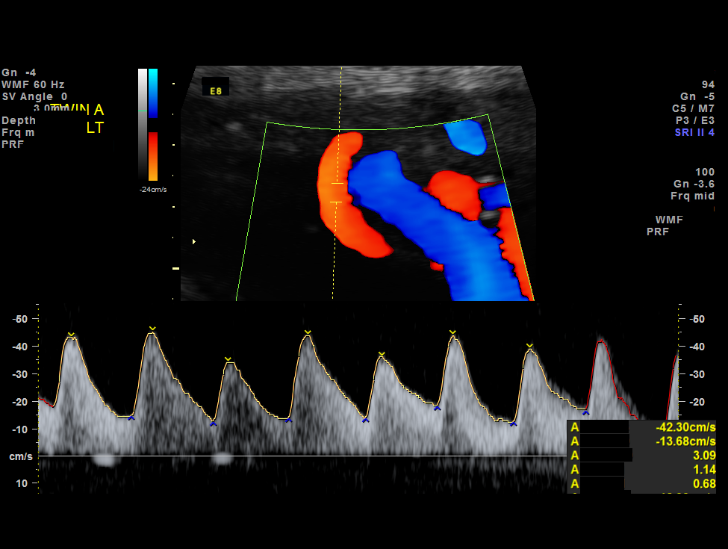
[im 10/22]
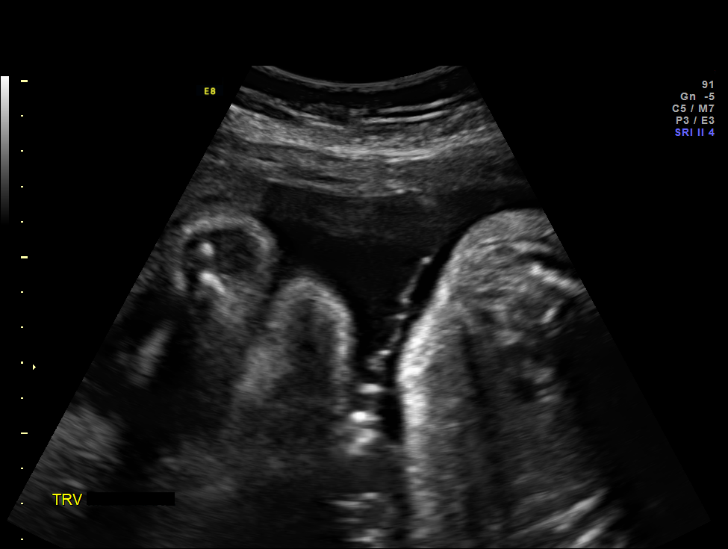
[im 12/22]
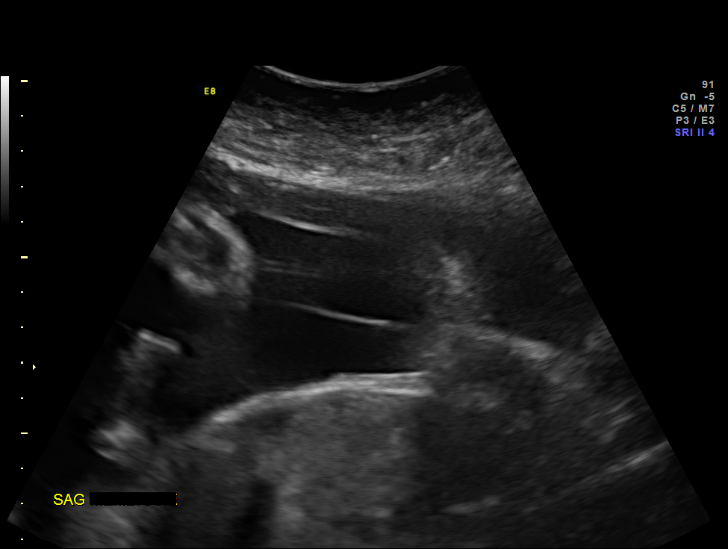
[im 15/22]
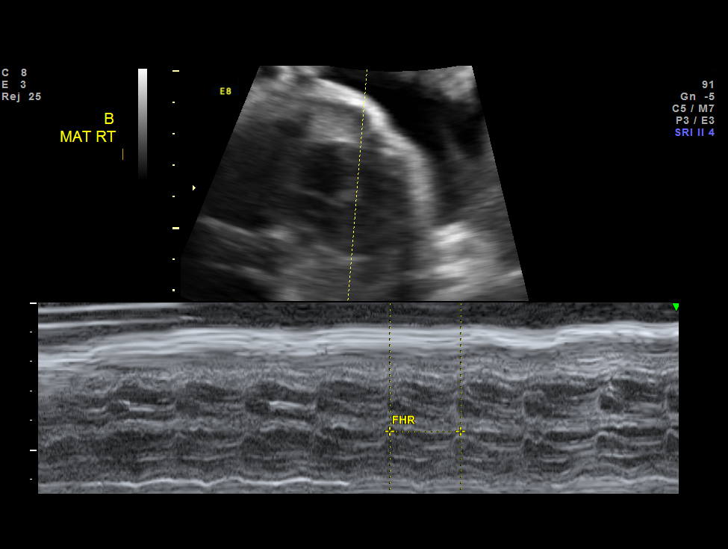
[im 16/22]
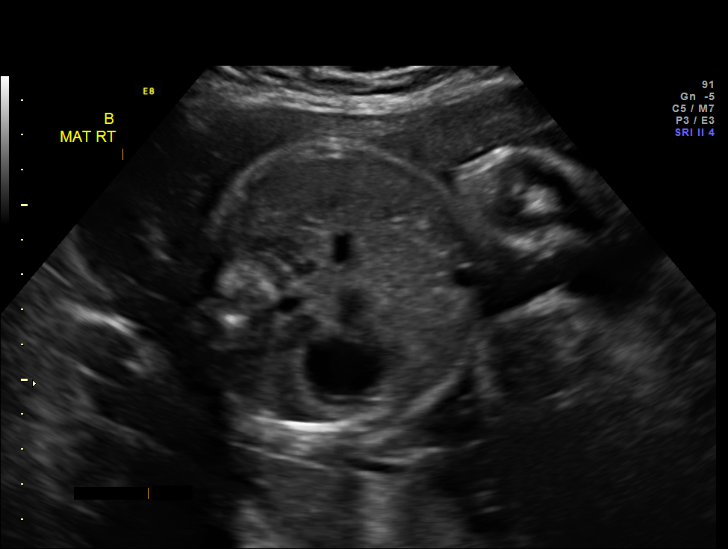
[im 17/22]
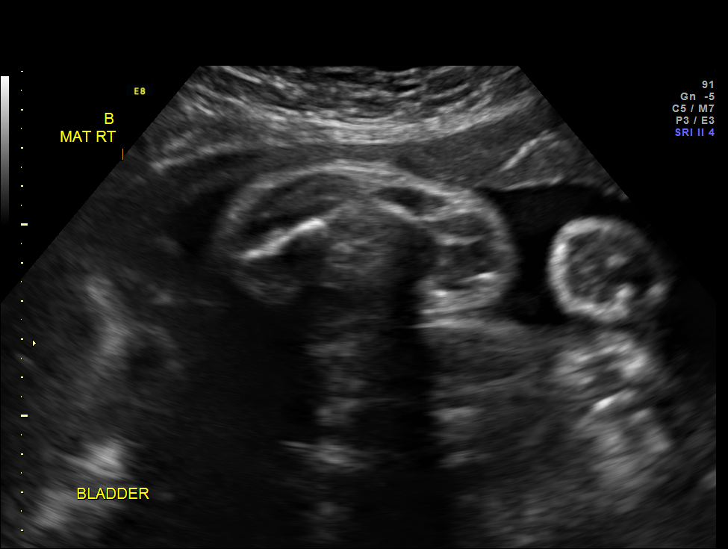
[im 20/22]
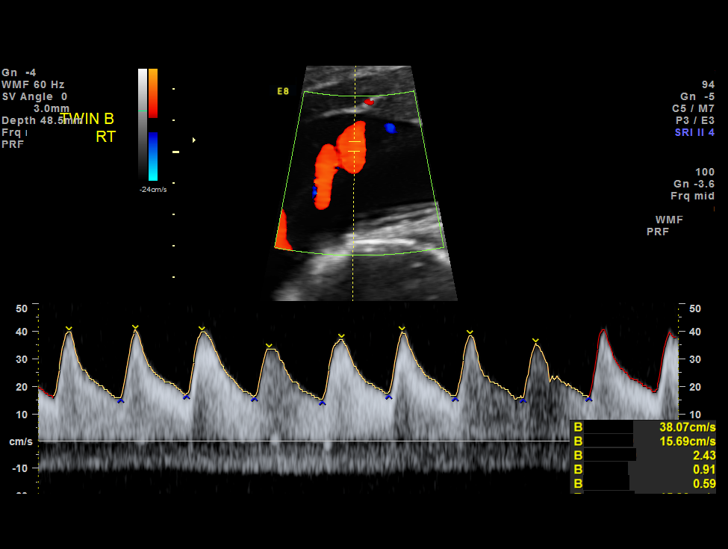
[im 22/22]
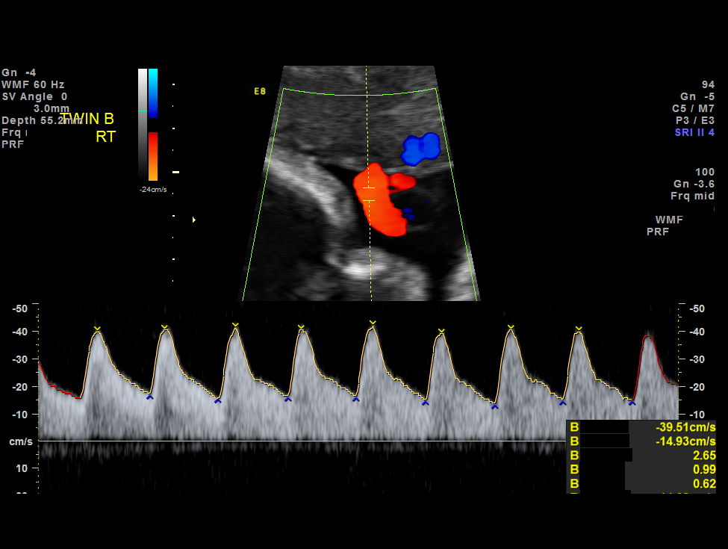

[12 of 16 positions shown; findings below may reference images not displayed]

OBSTETRICS REPORT
                      (Signed Final 04/13/2013 [DATE])

Service(s) Provided

 US UA CORD DOPPLER                                    76820.0
 US UA ADDL GEST                                       76820.1
Indications

 Di-Di Twin pregnancy, twins discordant, antepartum
 Size less than dates (Small for gestational [AGE]
 Twin B (AC<3%)
 Polyhydramnios - Twin B
 Poor obstetric history: Previous preterm delivery
 (36 weeks)
 Cervical shortening
Fetal Evaluation (Fetus A)

 Num Of Fetuses:    2
 Fetal Heart Rate:  136                          bpm
 Cardiac Activity:  Observed
 Fetal Lie:         Lower left Fetus
 Presentation:      Breech
 Placenta:          Posterior, above cervical
                    os
 P. Cord            Previously Visualized
 Insertion:

 Membrane Desc:     Dividing
                    Membrane seen
                    - Dichorionic.

 Amniotic Fluid
 AFI FV:      Subjectively within normal limits
                                             Larg Pckt:     3.3  cm
Biophysical Evaluation (Fetus A)

 Amniotic F.V:   Within normal limits       F. Tone:        Observed
 F. Movement:    Observed                   Score:          [DATE]
 F. Breathing:   Observed
Gestational Age (Fetus A)
 LMP:           33w 6d        Date:  08/19/12                 EDD:   05/26/13
 Best:          33w 6d     Det. By:  LMP  (08/19/12)          EDD:   05/26/13
Doppler - Fetal Vessels (Fetus A)

 Umbilical Artery
 S/D:   2.68           56  %tile       RI:
 PI:    1                              PSV:       44.7    cm/s
 Umbilical Artery
 Absent DFV:    No     Reverse DFV:    No

Fetal Evaluation (Fetus B)

 Num Of Fetuses:    2
 Fetal Heart Rate:  142                          bpm
 Cardiac Activity:  Observed
 Fetal Lie:         Upper right Fetus
 Presentation:      Breech
 Placenta:          Anterior right, above
                    cervical os
 P. Cord            Previously Visualized
 Insertion:

 Membrane Desc:     Dividing
                    Membrane seen
                    - Dichorionic.

 Amniotic Fluid
 AFI FV:      Subjectively within normal limits
                                             Larg Pckt:     6.0  cm
Biophysical Evaluation (Fetus B)

 Amniotic F.V:   Within normal limits       F. Tone:        Observed
 F. Movement:    Observed                   Score:          [DATE]
 F. Breathing:   Observed
Gestational Age (Fetus B)

 LMP:           33w 6d        Date:  08/19/12                 EDD:   05/26/13
 Best:          33w 6d     Det. By:  LMP  (08/19/12)          EDD:   05/26/13
Doppler - Fetal Vessels (Fetus B)

 Umbilical Artery
 S/D:   2.57           51  %tile       RI:
 PI:    0.96                           PSV:       39.51   cm/s
 Umbilical Artery
 Absent DFV:    No     Reverse DFV:    No

Impression

 DC/DA twins at 33 [DATE] weeks
 Follow up due to lagging growth and prominent stomach
 bubble / polyhydramnios of Twin B - normal cell free fetal DNA
 BPP [DATE] x 2
 Normal UA Doppler studies for gestational age with both twins
 Normal amniotic fluid volume noted in both twins (max
 verticle pocket on Twin B - 6 cm today)
Recommendations

 Recommend 2x weekly NST with weekly AFIs.
 Follow up growth scan in 3 weeks.
 If testing remains reassuring, recommend delivery at 38
 weeks gestation.

## 2013-04-13 NOTE — Progress Notes (Signed)
Ryleigh Guisinger  was seen today for an ultrasound appointment.  See full report in [facility].  Impression: DC/DA twins at 60 6/7 weeks Follow up due to lagging growth and prominent stomach bubble / polyhydramnios of Twin B - normal cell free fetal DNA BPP 8/8 x 2 Normal UA Doppler studies for gestational age with both twins Normal amniotic fluid volume noted in both twins (max verticle pocket on Twin B - 6 cm today)  Recommendations: Recommend 2x weekly NST with weekly AFIs. Follow up growth scan in 3 weeks. If testing remains reassuring, recommend delivery at [redacted] weeks gestation.  Alpha Gula, MD

## 2013-04-18 ENCOUNTER — Ambulatory Visit (HOSPITAL_COMMUNITY): Payer: BC Managed Care – PPO

## 2013-04-21 ENCOUNTER — Ambulatory Visit (HOSPITAL_COMMUNITY): Payer: 59

## 2013-04-21 ENCOUNTER — Other Ambulatory Visit (HOSPITAL_COMMUNITY): Payer: 59

## 2013-04-25 ENCOUNTER — Other Ambulatory Visit (HOSPITAL_COMMUNITY): Payer: BC Managed Care – PPO

## 2013-04-26 ENCOUNTER — Other Ambulatory Visit (HOSPITAL_COMMUNITY): Payer: Self-pay | Admitting: Obstetrics and Gynecology

## 2013-04-26 DIAGNOSIS — O30009 Twin pregnancy, unspecified number of placenta and unspecified number of amniotic sacs, unspecified trimester: Secondary | ICD-10-CM

## 2013-04-28 ENCOUNTER — Ambulatory Visit (HOSPITAL_COMMUNITY)
Admission: RE | Admit: 2013-04-28 | Discharge: 2013-04-28 | Disposition: A | Discharge status: Home / Self Care | Payer: BC Managed Care – PPO | Source: Ambulatory Visit | Attending: Obstetrics and Gynecology | Admitting: Obstetrics and Gynecology

## 2013-04-28 DIAGNOSIS — Z8751 Personal history of pre-term labor: Secondary | ICD-10-CM | POA: Insufficient documentation

## 2013-04-28 DIAGNOSIS — O30009 Twin pregnancy, unspecified number of placenta and unspecified number of amniotic sacs, unspecified trimester: Secondary | ICD-10-CM

## 2013-04-28 DIAGNOSIS — O26879 Cervical shortening, unspecified trimester: Secondary | ICD-10-CM | POA: Insufficient documentation

## 2013-04-28 DIAGNOSIS — O409XX0 Polyhydramnios, unspecified trimester, not applicable or unspecified: Secondary | ICD-10-CM | POA: Insufficient documentation

## 2013-04-28 DIAGNOSIS — O365990 Maternal care for other known or suspected poor fetal growth, unspecified trimester, not applicable or unspecified: Secondary | ICD-10-CM | POA: Insufficient documentation

## 2013-04-28 IMAGING — US US OB LIMITED
1 series · 13 of 28 positions shown · non-contrast
Comparison: none

[Series 1: us ob limited · 13 of 32 slices shown]
[im 2/32]
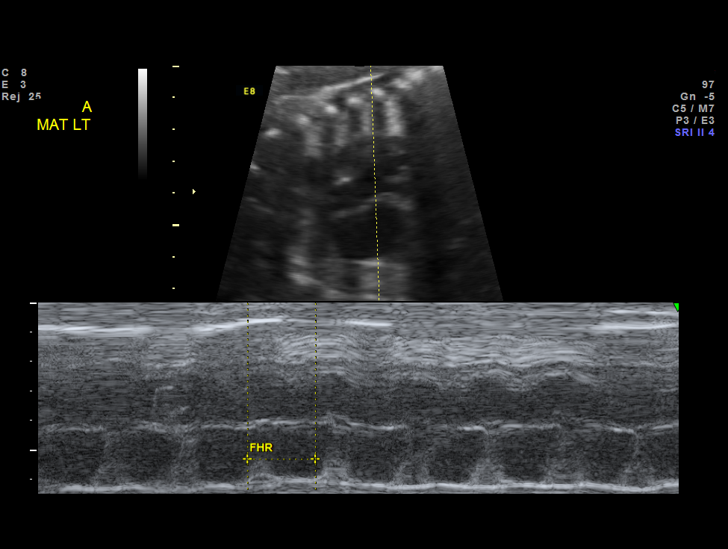
[im 4/32]
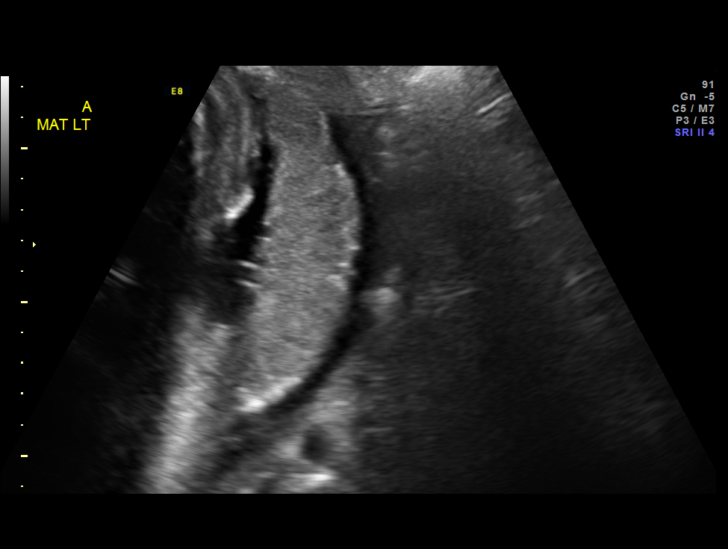
[im 6/32]
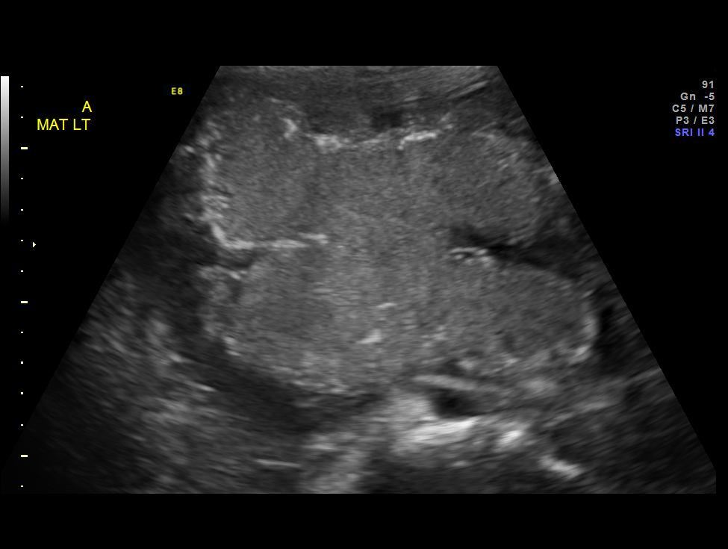
[im 9/32]
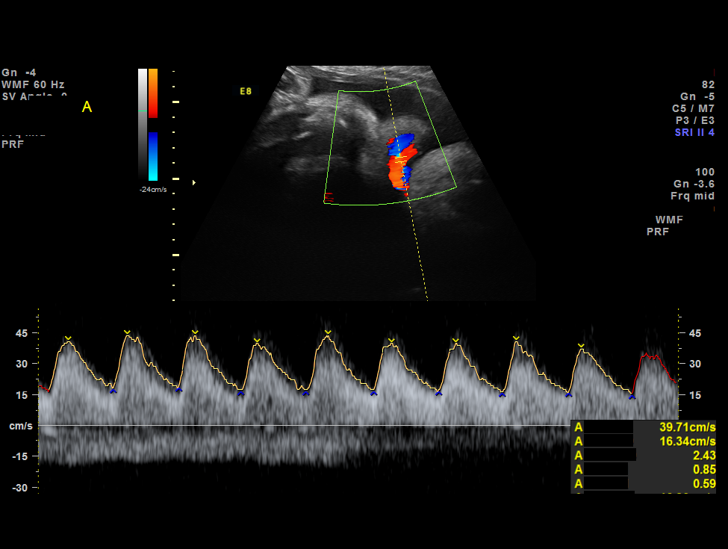
[im 11/32]
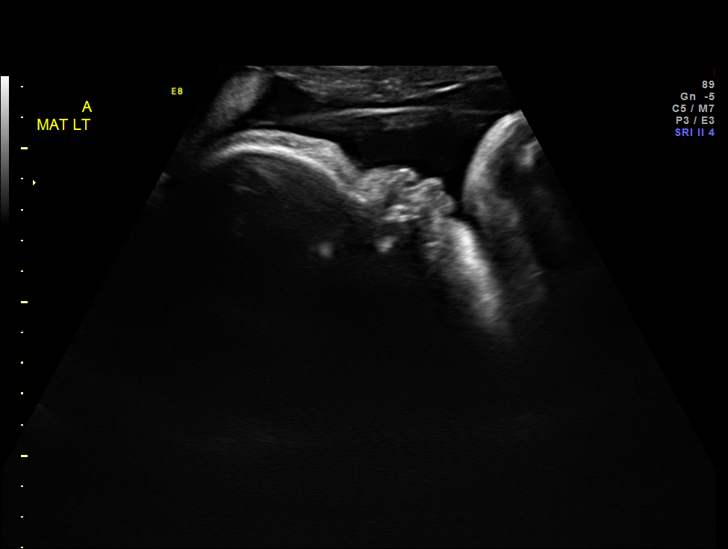
[im 13/32]
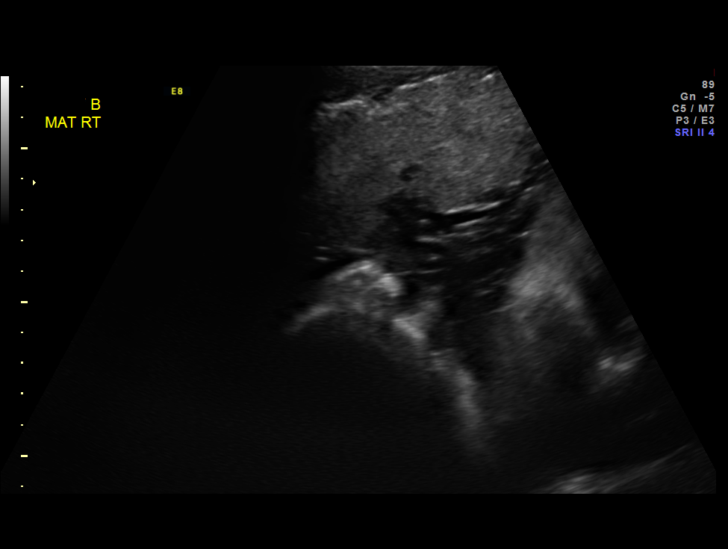
[im 17/32]
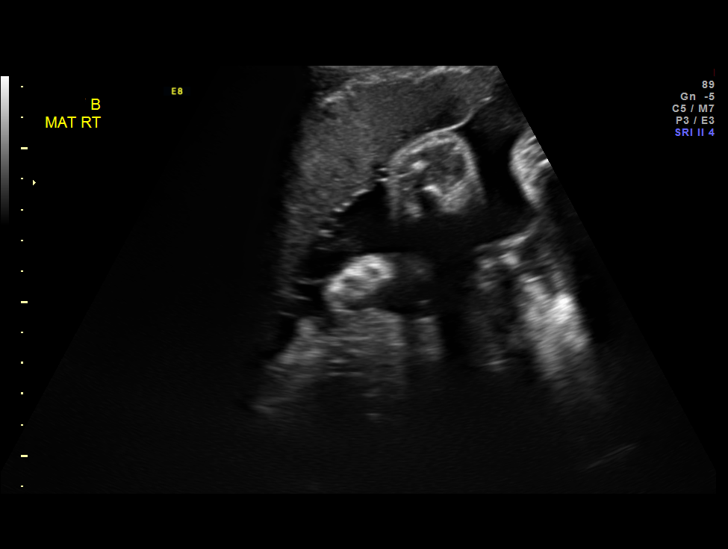
[im 19/32]
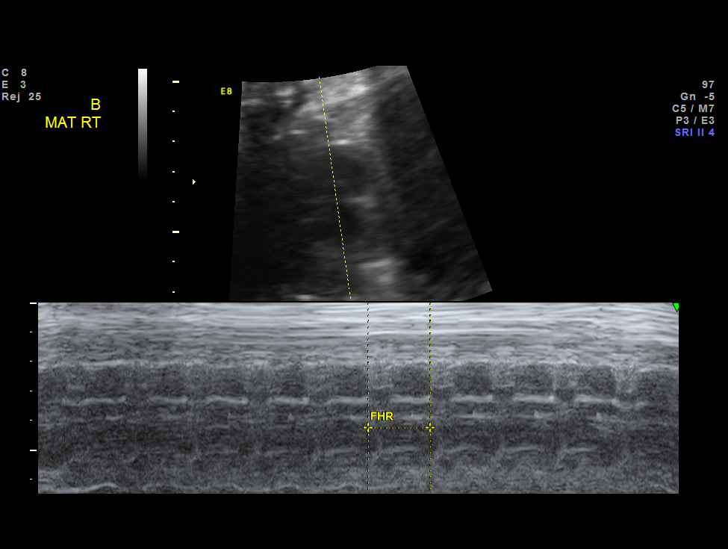
[im 21/32]
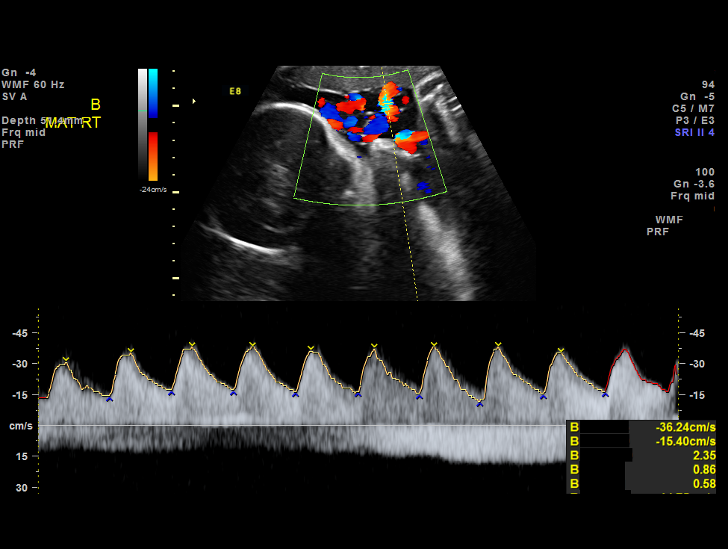
[im 23/32]
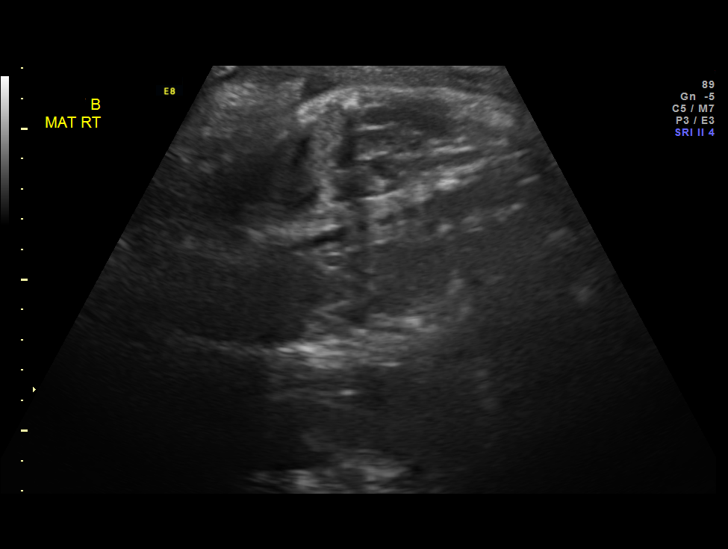
[im 26/32]
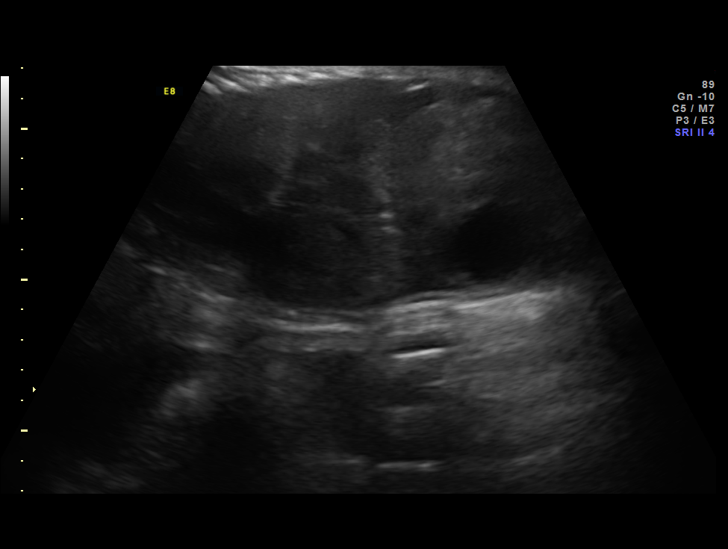
[im 28/32]
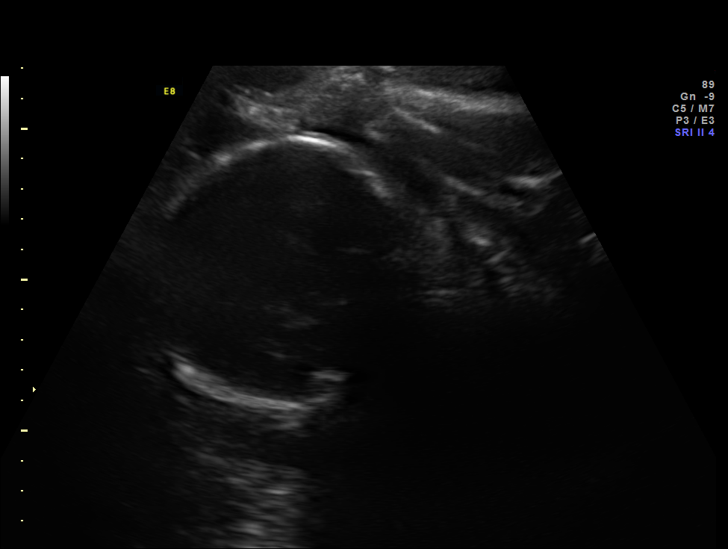
[im 30/32]
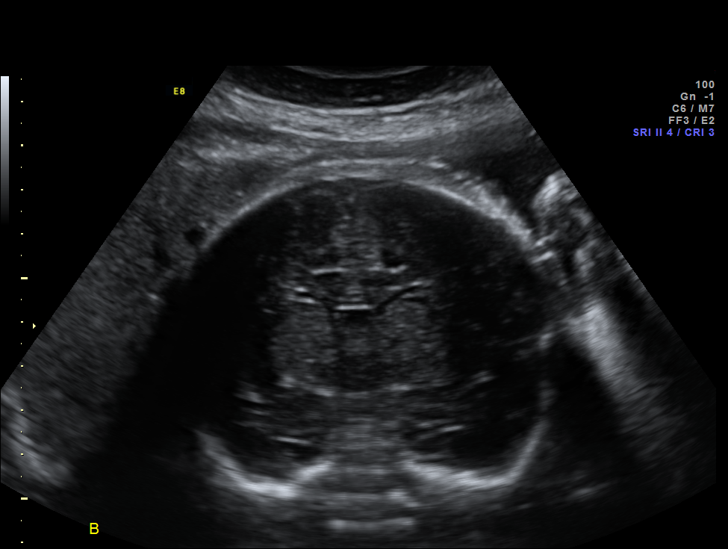

[13 of 28 positions shown; findings below may reference images not displayed]

OBSTETRICS REPORT
                      (Signed Final 04/28/2013 [DATE])

Service(s) Provided

 [HOSPITAL]                                         76815.0
 US UA CORD DOPPLER                                    76820.0
 US UA ADDL GEST                                       76820.1
Indications

 Di-Di Twin pregnancy, twins discordant, antepartum
 Size less than dates (Small for gestational [AGE]
 Twin B (AC<3%)
 Polyhydramnios - Twin B
 Poor obstetric history: Previous preterm delivery
 (36 weeks)
 Cervical shortening
Fetal Evaluation (Fetus A)

 Num Of Fetuses:    2
 Fetal Heart Rate:  148                          bpm
 Cardiac Activity:  Observed
 Fetal Lie:         Lower left Fetus
 Presentation:      Breech
 Placenta:          Posterior, above cervical
                    os
 P. Cord            Previously Visualized
 Insertion:

 Membrane Desc:     Dividing
                    Membrane seen
                    - Dichorionic.

 Amniotic Fluid
 AFI FV:      Subjectively within normal limits
                                             Larg Pckt:     3.1  cm
Gestational Age (Fetus A)

 LMP:           36w 0d        Date:  08/19/12                 EDD:   05/26/13
 Best:          36w 0d     Det. By:  LMP  (08/19/12)          EDD:   05/26/13
Doppler - Fetal Vessels (Fetus A)

 Umbilical Artery
 S/D:   2.67           63  %tile       RI:
 PI:    0.94                           PSV:       48.93   cm/s
Fetal Evaluation (Fetus B)

 Num Of Fetuses:    2
 Fetal Heart Rate:  163                          bpm
 Cardiac Activity:  Observed
 Fetal Lie:         Upper right Fetus
 Presentation:      Transverse, head to
                    maternal right
 Placenta:          Anterior right, above
                    cervical os
 P. Cord            Previously Visualized
 Insertion:

 Membrane Desc:     Dividing
                    Membrane seen
                    - Dichorionic.

 Amniotic Fluid
 AFI FV:      Subjectively within normal limits
                                             Larg Pckt:     4.7  cm
Gestational Age (Fetus B)

 LMP:           36w 0d        Date:  08/19/12                 EDD:   05/26/13
 Best:          36w 0d     Det. By:  LMP  (08/19/12)          EDD:   05/26/13
Doppler - Fetal Vessels (Fetus B)

 Umbilical Artery
 S/D:   2.41           50  %tile       RI:
 PI:    0.86                           PSV:       39.98   cm/s

Impression

 Dichorionic/diamniotic twin pregnancy at 36+0 weeks
 Twin B: lagging growth
 Normal amniotic fluid volume x 2
 UA dopplers were normal for this GA x 2
 NSTs reactive x 2
Recommendations

 Continue antenatal testing
 Deliver by 38 weeks

## 2013-05-02 ENCOUNTER — Other Ambulatory Visit (HOSPITAL_COMMUNITY): Payer: BC Managed Care – PPO

## 2013-05-03 ENCOUNTER — Encounter (HOSPITAL_COMMUNITY): Payer: Self-pay | Admitting: Pharmacist

## 2013-05-05 ENCOUNTER — Ambulatory Visit (HOSPITAL_COMMUNITY): Payer: BC Managed Care – PPO

## 2013-05-05 ENCOUNTER — Other Ambulatory Visit (HOSPITAL_COMMUNITY): Payer: 59

## 2013-05-05 NOTE — Patient Instructions (Addendum)
Your procedure is scheduled on: 05/09/2013  Enter through the Main Entrance of [facility] at: 0930AM  Pick up the phone at the desk and dial [phone].  Call this number if you have problems the morning of surgery: [phone].  Remember: Do NOT eat food: AFTER MIDNIGHT 05/08/2013 Do NOT drink clear liquids after: AFTER MIDNIGHT 05/08/2013 Take these medicines the morning of surgery with a SIP OF WATER: NONE  Do NOT wear jewelry (body piercing), make-up, or nail polish. Do NOT wear lotions, powders, or perfumes.  You may wear deoderant. Do NOT shave for 48 hours prior to surgery. Do NOT bring valuables to the hospital. Contacts, dentures, or bridgework may not be worn into surgery. Leave suitcase in car.  After surgery it may be brought to your room.  For patients admitted to the hospital, checkout time is 11:00 AM the day of discharge.

## 2013-05-06 ENCOUNTER — Encounter (HOSPITAL_COMMUNITY)
Admission: RE | Admit: 2013-05-06 | Discharge: 2013-05-06 | Disposition: A | Discharge status: Home / Self Care | Payer: BC Managed Care – PPO | Source: Ambulatory Visit | Attending: Obstetrics and Gynecology | Admitting: Obstetrics and Gynecology

## 2013-05-06 ENCOUNTER — Encounter (HOSPITAL_COMMUNITY): Payer: Self-pay

## 2013-05-06 DIAGNOSIS — Z01812 Encounter for preprocedural laboratory examination: Secondary | ICD-10-CM | POA: Insufficient documentation

## 2013-05-06 HISTORY — DX: Other complications of anesthesia, initial encounter: T88.59XA

## 2013-05-06 HISTORY — DX: Fibromyalgia: M79.7

## 2013-05-06 HISTORY — DX: Anemia, unspecified: D64.9

## 2013-05-06 HISTORY — DX: Headache: R51

## 2013-05-06 HISTORY — DX: Adverse effect of unspecified anesthetic, initial encounter: T41.45XA

## 2013-05-06 LAB — CBC
HCT: 36.4 % (ref 36.0–46.0)
Hemoglobin: 12.4 g/dL (ref 12.0–15.0)
MCV: 85 fL (ref 78.0–100.0)
RDW: 14.8 % (ref 11.5–15.5)
WBC: 8.6 10*3/uL (ref 4.0–10.5)

## 2013-05-06 LAB — RPR: RPR Ser Ql: NONREACTIVE

## 2013-05-06 LAB — TYPE AND SCREEN: Antibody Screen: NEGATIVE

## 2013-05-09 ENCOUNTER — Encounter (HOSPITAL_COMMUNITY): Payer: Self-pay | Admitting: Certified Registered"

## 2013-05-09 ENCOUNTER — Encounter (HOSPITAL_COMMUNITY): Payer: BC Managed Care – PPO | Admitting: Anesthesiology

## 2013-05-09 ENCOUNTER — Inpatient Hospital Stay (HOSPITAL_COMMUNITY)
Admission: RE | Admit: 2013-05-09 | Discharge: 2013-05-12 | DRG: 371 | Disposition: A | Discharge status: Home / Self Care | Payer: BC Managed Care – PPO | Source: Ambulatory Visit | Attending: Obstetrics and Gynecology | Admitting: Obstetrics and Gynecology

## 2013-05-09 ENCOUNTER — Encounter (HOSPITAL_COMMUNITY)
Admission: RE | Disposition: A | Discharge status: Home / Self Care | Payer: Self-pay | Source: Ambulatory Visit | Attending: Obstetrics and Gynecology

## 2013-05-09 ENCOUNTER — Inpatient Hospital Stay (HOSPITAL_COMMUNITY): Payer: BC Managed Care – PPO | Admitting: Anesthesiology

## 2013-05-09 DIAGNOSIS — O30049 Twin pregnancy, dichorionic/diamniotic, unspecified trimester: Secondary | ICD-10-CM

## 2013-05-09 DIAGNOSIS — O99892 Other specified diseases and conditions complicating childbirth: Secondary | ICD-10-CM | POA: Diagnosis present

## 2013-05-09 DIAGNOSIS — O30009 Twin pregnancy, unspecified number of placenta and unspecified number of amniotic sacs, unspecified trimester: Secondary | ICD-10-CM | POA: Diagnosis present

## 2013-05-09 DIAGNOSIS — O409XX0 Polyhydramnios, unspecified trimester, not applicable or unspecified: Secondary | ICD-10-CM | POA: Diagnosis present

## 2013-05-09 DIAGNOSIS — Z22330 Carrier of Group B streptococcus: Secondary | ICD-10-CM

## 2013-05-09 DIAGNOSIS — O3090 Multiple gestation, unspecified, unspecified trimester: Principal | ICD-10-CM | POA: Diagnosis present

## 2013-05-09 LAB — TYPE AND SCREEN
ABO/RH(D): B POS
Antibody Screen: NEGATIVE

## 2013-05-09 SURGERY — Surgical Case
Anesthesia: Spinal | Site: Abdomen | Wound class: Clean Contaminated

## 2013-05-09 MED ORDER — OXYCODONE-ACETAMINOPHEN 5-325 MG PO TABS
1.0000 | ORAL_TABLET | ORAL | Status: DC | PRN
  Administered 2013-05-10: 1 via ORAL
  Administered 2013-05-10: 2 via ORAL
  Administered 2013-05-10: 1 via ORAL
  Administered 2013-05-10 – 2013-05-12 (×7): 2 via ORAL
  Filled 2013-05-09 (×4): qty 2
  Filled 2013-05-09 (×2): qty 1
  Filled 2013-05-09 (×2): qty 2
  Filled 2013-05-09: qty 1
  Filled 2013-05-09 (×2): qty 2

## 2013-05-09 MED ORDER — KETOROLAC TROMETHAMINE 30 MG/ML IJ SOLN: 30.0000 mg | Freq: Four times a day (QID) | INTRAMUSCULAR | Status: AC | PRN

## 2013-05-09 MED ORDER — DIPHENHYDRAMINE HCL 50 MG/ML IJ SOLN: 12.5000 mg | INTRAMUSCULAR | Status: DC | PRN

## 2013-05-09 MED ORDER — SCOPOLAMINE 1 MG/3DAYS TD PT72
1.0000 | MEDICATED_PATCH | Freq: Once | TRANSDERMAL | Status: DC
  Administered 2013-05-09: 1.5 mg via TRANSDERMAL

## 2013-05-09 MED ORDER — WITCH HAZEL-GLYCERIN EX PADS: 1.0000 "application " | MEDICATED_PAD | CUTANEOUS | Status: DC | PRN

## 2013-05-09 MED ORDER — LANOLIN HYDROUS EX OINT: 1.0000 "application " | TOPICAL_OINTMENT | CUTANEOUS | Status: DC | PRN

## 2013-05-09 MED ORDER — MAGNESIUM HYDROXIDE 400 MG/5ML PO SUSP: 30.0000 mL | ORAL | Status: DC | PRN

## 2013-05-09 MED ORDER — DIPHENHYDRAMINE HCL 25 MG PO CAPS: 25.0000 mg | ORAL_CAPSULE | Freq: Four times a day (QID) | ORAL | Status: DC | PRN

## 2013-05-09 MED ORDER — ZOLPIDEM TARTRATE 5 MG PO TABS: 5.0000 mg | ORAL_TABLET | Freq: Every evening | ORAL | Status: DC | PRN

## 2013-05-09 MED ORDER — SCOPOLAMINE 1 MG/3DAYS TD PT72
MEDICATED_PATCH | TRANSDERMAL | Status: AC
  Filled 2013-05-09: qty 1

## 2013-05-09 MED ORDER — METOCLOPRAMIDE HCL 5 MG/ML IJ SOLN: 10.0000 mg | Freq: Three times a day (TID) | INTRAMUSCULAR | Status: DC | PRN

## 2013-05-09 MED ORDER — LACTATED RINGERS IV SOLN
INTRAVENOUS | Status: DC
  Administered 2013-05-09 (×3): via INTRAVENOUS

## 2013-05-09 MED ORDER — MENTHOL 3 MG MT LOZG: 1.0000 | LOZENGE | OROMUCOSAL | Status: DC | PRN

## 2013-05-09 MED ORDER — PHENYLEPHRINE HCL 10 MG/ML IJ SOLN
20.0000 mg | INTRAVENOUS | Status: DC | PRN
  Administered 2013-05-09: 60 ug/min via INTRAVENOUS
  Administered 2013-05-09: 40 ug via INTRAVENOUS

## 2013-05-09 MED ORDER — MEPERIDINE HCL 25 MG/ML IJ SOLN
INTRAMUSCULAR | Status: DC | PRN
  Administered 2013-05-09 (×2): 12.5 mg via INTRAVENOUS

## 2013-05-09 MED ORDER — SIMETHICONE 80 MG PO CHEW
80.0000 mg | CHEWABLE_TABLET | ORAL | Status: DC | PRN
  Administered 2013-05-11: 80 mg via ORAL
  Filled 2013-05-09: qty 1

## 2013-05-09 MED ORDER — SODIUM CHLORIDE 0.9 % IJ SOLN: 3.0000 mL | INTRAMUSCULAR | Status: DC | PRN

## 2013-05-09 MED ORDER — MEPERIDINE HCL 25 MG/ML IJ SOLN
INTRAMUSCULAR | Status: AC
  Filled 2013-05-09: qty 1

## 2013-05-09 MED ORDER — OXYTOCIN 10 UNIT/ML IJ SOLN
40.0000 [IU] | INTRAVENOUS | Status: DC | PRN
  Administered 2013-05-09: 40 [IU] via INTRAVENOUS

## 2013-05-09 MED ORDER — SENNOSIDES-DOCUSATE SODIUM 8.6-50 MG PO TABS
2.0000 | ORAL_TABLET | ORAL | Status: DC
  Administered 2013-05-10 – 2013-05-12 (×3): 2 via ORAL
  Filled 2013-05-09 (×3): qty 2

## 2013-05-09 MED ORDER — TETANUS-DIPHTH-ACELL PERTUSSIS 5-2.5-18.5 LF-MCG/0.5 IM SUSP
0.5000 mL | Freq: Once | INTRAMUSCULAR | Status: DC
  Filled 2013-05-09: qty 0.5

## 2013-05-09 MED ORDER — HYDROMORPHONE HCL PF 1 MG/ML IJ SOLN
0.2500 mg | INTRAMUSCULAR | Status: DC | PRN
  Administered 2013-05-09: 0.5 mg via INTRAVENOUS

## 2013-05-09 MED ORDER — DEXTROSE 5 % IV SOLN
1.0000 ug/kg/h | INTRAVENOUS | Status: DC | PRN
  Filled 2013-05-09: qty 2

## 2013-05-09 MED ORDER — KETOROLAC TROMETHAMINE 60 MG/2ML IM SOLN
INTRAMUSCULAR | Status: AC
  Administered 2013-05-09: 60 mg via INTRAMUSCULAR
  Filled 2013-05-09: qty 2

## 2013-05-09 MED ORDER — SCOPOLAMINE 1 MG/3DAYS TD PT72: 1.0000 | MEDICATED_PATCH | Freq: Once | TRANSDERMAL | Status: DC

## 2013-05-09 MED ORDER — DIPHENHYDRAMINE HCL 50 MG/ML IJ SOLN: 25.0000 mg | INTRAMUSCULAR | Status: DC | PRN

## 2013-05-09 MED ORDER — NALBUPHINE HCL 10 MG/ML IJ SOLN
5.0000 mg | INTRAMUSCULAR | Status: DC | PRN
  Administered 2013-05-09: 5 mg via SUBCUTANEOUS
  Filled 2013-05-09 (×3): qty 1

## 2013-05-09 MED ORDER — ONDANSETRON HCL 4 MG PO TABS: 4.0000 mg | ORAL_TABLET | ORAL | Status: DC | PRN

## 2013-05-09 MED ORDER — PRENATAL MULTIVITAMIN CH
1.0000 | ORAL_TABLET | Freq: Every day | ORAL | Status: DC
  Administered 2013-05-11 – 2013-05-12 (×2): 1 via ORAL
  Filled 2013-05-09 (×3): qty 1

## 2013-05-09 MED ORDER — MORPHINE SULFATE (PF) 0.5 MG/ML IJ SOLN
INTRAMUSCULAR | Status: DC | PRN
  Administered 2013-05-09: .15 mg via EPIDURAL

## 2013-05-09 MED ORDER — CEFAZOLIN SODIUM-DEXTROSE 2-3 GM-% IV SOLR
2.0000 g | INTRAVENOUS | Status: AC
  Administered 2013-05-09: 2 g via INTRAVENOUS

## 2013-05-09 MED ORDER — SIMETHICONE 80 MG PO CHEW
80.0000 mg | CHEWABLE_TABLET | ORAL | Status: DC
  Administered 2013-05-10 – 2013-05-12 (×3): 80 mg via ORAL
  Filled 2013-05-09 (×3): qty 1

## 2013-05-09 MED ORDER — IBUPROFEN 600 MG PO TABS
600.0000 mg | ORAL_TABLET | Freq: Four times a day (QID) | ORAL | Status: DC
  Administered 2013-05-10 – 2013-05-12 (×10): 600 mg via ORAL
  Filled 2013-05-09 (×10): qty 1

## 2013-05-09 MED ORDER — KETOROLAC TROMETHAMINE 30 MG/ML IJ SOLN
30.0000 mg | Freq: Four times a day (QID) | INTRAMUSCULAR | Status: AC | PRN
  Administered 2013-05-09: 30 mg via INTRAVENOUS
  Filled 2013-05-09 (×2): qty 1

## 2013-05-09 MED ORDER — OXYTOCIN 10 UNIT/ML IJ SOLN
INTRAMUSCULAR | Status: AC
  Filled 2013-05-09: qty 4

## 2013-05-09 MED ORDER — NALBUPHINE HCL 10 MG/ML IJ SOLN
5.0000 mg | INTRAMUSCULAR | Status: DC | PRN
  Filled 2013-05-09: qty 1

## 2013-05-09 MED ORDER — FENTANYL CITRATE 0.05 MG/ML IJ SOLN
INTRAMUSCULAR | Status: DC | PRN
  Administered 2013-05-09: 25 ug via INTRATHECAL

## 2013-05-09 MED ORDER — ONDANSETRON HCL 4 MG/2ML IJ SOLN: 4.0000 mg | INTRAMUSCULAR | Status: DC | PRN

## 2013-05-09 MED ORDER — ONDANSETRON HCL 4 MG/2ML IJ SOLN
INTRAMUSCULAR | Status: AC
  Filled 2013-05-09: qty 2

## 2013-05-09 MED ORDER — FENTANYL CITRATE 0.05 MG/ML IJ SOLN
INTRAMUSCULAR | Status: AC
  Filled 2013-05-09: qty 2

## 2013-05-09 MED ORDER — OXYTOCIN 40 UNITS IN LACTATED RINGERS INFUSION - SIMPLE MED: 62.5000 mL/h | INTRAVENOUS | Status: AC

## 2013-05-09 MED ORDER — LACTATED RINGERS IV SOLN
INTRAVENOUS | Status: DC
  Administered 2013-05-09 (×2): via INTRAVENOUS

## 2013-05-09 MED ORDER — KETOROLAC TROMETHAMINE 60 MG/2ML IM SOLN
60.0000 mg | Freq: Once | INTRAMUSCULAR | Status: AC | PRN
  Administered 2013-05-09: 60 mg via INTRAMUSCULAR

## 2013-05-09 MED ORDER — CEFAZOLIN SODIUM-DEXTROSE 2-3 GM-% IV SOLR
INTRAVENOUS | Status: AC
  Filled 2013-05-09: qty 50

## 2013-05-09 MED ORDER — MORPHINE SULFATE 0.5 MG/ML IJ SOLN
INTRAMUSCULAR | Status: AC
  Filled 2013-05-09: qty 10

## 2013-05-09 MED ORDER — ONDANSETRON HCL 4 MG/2ML IJ SOLN: 4.0000 mg | Freq: Three times a day (TID) | INTRAMUSCULAR | Status: DC | PRN

## 2013-05-09 MED ORDER — SIMETHICONE 80 MG PO CHEW
80.0000 mg | CHEWABLE_TABLET | Freq: Three times a day (TID) | ORAL | Status: DC
  Administered 2013-05-09 – 2013-05-12 (×7): 80 mg via ORAL
  Filled 2013-05-09 (×7): qty 1

## 2013-05-09 MED ORDER — NALOXONE HCL 0.4 MG/ML IJ SOLN: 0.4000 mg | INTRAMUSCULAR | Status: DC | PRN

## 2013-05-09 MED ORDER — DIBUCAINE 1 % RE OINT: 1.0000 "application " | TOPICAL_OINTMENT | RECTAL | Status: DC | PRN

## 2013-05-09 MED ORDER — DIPHENHYDRAMINE HCL 25 MG PO CAPS
25.0000 mg | ORAL_CAPSULE | ORAL | Status: DC | PRN
  Filled 2013-05-09: qty 1

## 2013-05-09 MED ORDER — HYDROMORPHONE HCL PF 1 MG/ML IJ SOLN
INTRAMUSCULAR | Status: AC
  Administered 2013-05-09: 0.5 mg via INTRAVENOUS
  Filled 2013-05-09: qty 1

## 2013-05-09 MED ORDER — MEASLES, MUMPS & RUBELLA VAC ~~LOC~~ INJ
0.5000 mL | INJECTION | Freq: Once | SUBCUTANEOUS | Status: DC
  Filled 2013-05-09: qty 0.5

## 2013-05-09 MED ORDER — MEPERIDINE HCL 25 MG/ML IJ SOLN: 6.2500 mg | INTRAMUSCULAR | Status: DC | PRN

## 2013-05-09 MED ORDER — LACTATED RINGERS IV SOLN
INTRAVENOUS | Status: DC
  Administered 2013-05-09: 17:00:00 via INTRAVENOUS

## 2013-05-09 SURGICAL SUPPLY — 34 items
APL SKNCLS STERI-STRIP NONHPOA (GAUZE/BANDAGES/DRESSINGS) ×1
BENZOIN TINCTURE PRP APPL 2/3 (GAUZE/BANDAGES/DRESSINGS) ×1 IMPLANT
CLAMP CORD UMBIL (MISCELLANEOUS) ×1 IMPLANT
CLOTH BEACON ORANGE TIMEOUT ST (SAFETY) ×2 IMPLANT
CONTAINER PREFILL 10% NBF 15ML (MISCELLANEOUS) IMPLANT
DRAPE LG THREE QUARTER DISP (DRAPES) ×4 IMPLANT
DRSG OPSITE POSTOP 4X10 (GAUZE/BANDAGES/DRESSINGS) ×2 IMPLANT
DURAPREP 26ML APPLICATOR (WOUND CARE) ×2 IMPLANT
ELECT REM PT RETURN 9FT ADLT (ELECTROSURGICAL) ×2
ELECTRODE REM PT RTRN 9FT ADLT (ELECTROSURGICAL) ×1 IMPLANT
EXTRACTOR VACUUM KIWI (MISCELLANEOUS) IMPLANT
EXTRACTOR VACUUM M CUP 4 TUBE (SUCTIONS) IMPLANT
GLOVE BIO SURGEON STRL SZ8 (GLOVE) ×2 IMPLANT
GLOVE ORTHO TXT STRL SZ7.5 (GLOVE) ×2 IMPLANT
GOWN PREVENTION PLUS XLARGE (GOWN DISPOSABLE) ×2 IMPLANT
GOWN STRL REIN XL XLG (GOWN DISPOSABLE) ×2 IMPLANT
KIT ABG SYR 3ML LUER SLIP (SYRINGE) IMPLANT
NDL HYPO 25X5/8 SAFETYGLIDE (NEEDLE) ×1 IMPLANT
NEEDLE HYPO 25X5/8 SAFETYGLIDE (NEEDLE) ×2 IMPLANT
NS IRRIG 1000ML POUR BTL (IV SOLUTION) ×2 IMPLANT
PACK C SECTION WH (CUSTOM PROCEDURE TRAY) ×2 IMPLANT
PAD OB MATERNITY 4.3X12.25 (PERSONAL CARE ITEMS) ×2 IMPLANT
RTRCTR C-SECT PINK 25CM LRG (MISCELLANEOUS) ×2 IMPLANT
STAPLER VISISTAT 35W (STAPLE) IMPLANT
STRIP CLOSURE SKIN 1/2X4 (GAUZE/BANDAGES/DRESSINGS) ×1 IMPLANT
SUT CHROMIC 1 CTX 36 (SUTURE) ×4 IMPLANT
SUT PLAIN 0 NONE (SUTURE) IMPLANT
SUT PLAIN 2 0 XLH (SUTURE) IMPLANT
SUT VIC AB 0 CT1 27 (SUTURE) ×6
SUT VIC AB 0 CT1 27XBRD ANBCTR (SUTURE) ×2 IMPLANT
SUT VIC AB 4-0 KS 27 (SUTURE) IMPLANT
TOWEL OR 17X24 6PK STRL BLUE (TOWEL DISPOSABLE) ×2 IMPLANT
TRAY FOLEY CATH 14FR (SET/KITS/TRAYS/PACK) ×2 IMPLANT
WATER STERILE IRR 1000ML POUR (IV SOLUTION) ×2 IMPLANT

## 2013-05-09 NOTE — Anesthesia Postprocedure Evaluation (Signed)
  Anesthesia Post-op Note  Patient: Brittany Cunningham  Procedure(s) Performed: Procedure(s): CESAREAN SECTION/TWINS (N/A)  Patient is awake, responsive, moving her legs, and has signs of resolution of her numbness. Pain and nausea are reasonably well controlled. Vital signs are stable and clinically acceptable. Oxygen saturation is clinically acceptable. There are no apparent anesthetic complications at this time. Patient is ready for discharge.

## 2013-05-09 NOTE — Transfer of Care (Signed)
Immediate Anesthesia Transfer of Care Note  Patient: Brittany Cunningham  Procedure(s) Performed: Procedure(s): CESAREAN SECTION/TWINS (N/A)  Patient Location: PACU  Anesthesia Type:Spinal  Level of Consciousness: awake, alert , oriented and patient cooperative  Airway & Oxygen Therapy: Patient Spontanous Breathing  Post-op Assessment: Report given to PACU RN and Post -op Vital signs reviewed and stable  Post vital signs: stable  Complications: No apparent anesthesia complications

## 2013-05-09 NOTE — Anesthesia Postprocedure Evaluation (Signed)
  Anesthesia Post-op Note  Patient: Brittany Cunningham  Procedure(s) Performed: Procedure(s): CESAREAN SECTION/TWINS (N/A)  Patient Location: Pacu  Anesthesia Type:Spinal  Level of Consciousness: awake, alert  and oriented  Airway and Oxygen Therapy: Patient Spontanous Breathing  Post-op Pain: none  Post-op Assessment: Post-op Vital signs reviewed and Patient's Cardiovascular Status Stable  Post-op Vital Signs: Reviewed and stable  Complications: No apparent anesthesia complications

## 2013-05-09 NOTE — Lactation Note (Signed)
This note was copied from the chart of GirlB Lira Menger. Lactation Consultation Note  Patient Name: Brittany Cunningham [id number] Date: 05/09/2013 Reason for consult: Initial assessment;Late preterm infant;Multiple gestation;Infant < 6lbs.  This twin is smaller than her brother, weighing only 5 pounds 1 oz and has stable blood sugar levels but is sleepy, then becomes spitty and gaggy when LC attempted to awaken baby and latch her to mom's (R) breast.  Mom has short/soft nipples and baby is reluctant to open mouth wide, even when #20 NS used.  Mom has used DEBP and tried to hand express but only obtaining a few drops at this time.  Mom was sleepy and feeling "warm" with each twin STS, so will need additional assistance and reinforcement of teaching.  After attempting for 15 minutes, mom wanted to try with "A" and FOB is holding this twin STS.  Mom repeatedly falling asleep so LC suggested she rest and follow plan with both twins:  Offer breast to each twin for 10-15 minutes but if unable to latch or needing additional stimulation, mom to pump for additional 10-15 minutes with DEBP and spoon feed any colostrum as available..  Use and cleaning of NS reviewed.   LC provided [employer] brochure and reviewed [facility] services and list of community and web site resources.Parents encouraged to review Baby and Me pp 14 and 20-25 for STS and BF information.   Maternal Data Formula Feeding for Exclusion: No Infant to breast within first hour of birth: No Has patient been taught Hand Expression?: Yes Does the patient have breastfeeding experience prior to this delivery?: Yes  Feeding Feeding Type: Breast Fed  LATCH Score/Interventions Latch: Too sleepy or reluctant, no latch achieved, no sucking elicited. (spitty, gaggy, refusing to open mouth) Intervention(s): Skin to skin;Teach feeding cues;Waking techniques  Audible Swallowing: None Intervention(s): Skin to skin;Hand expression  Type of Nipple:  Everted at rest and after stimulation (nipples are short and soft)  Comfort (Breast/Nipple): Soft / non-tender     Hold (Positioning): Assistance needed to correctly position infant at breast and maintain latch. Intervention(s): Breastfeeding basics reviewed;Support Pillows;Position options;Skin to skin  LATCH Score: 5  Lactation Tools Discussed/Used Tools: Nipple Dorris Carnes;Pump Nipple shield size: 20 Breast pump type: Double-Electric Breast Pump Pump Review: Setup, frequency, and cleaning;Milk Storage Date initiated:: 05/09/13   Consult Status Consult Status: Follow-up Date: 05/10/13 Follow-up type: In-patient    Warrick Parisian [facility] 05/09/2013, 10:02 PM

## 2013-05-09 NOTE — Progress Notes (Signed)
All four side rails are up on the patients bed.  Educated patient that all four rails in the up position are considered a restraint and that at least one at the bottom needs to be let down.  Patient refuses for side rail to be lowered and keeps all four up.

## 2013-05-09 NOTE — Interval H&P Note (Signed)
History and Physical Interval Note:  05/09/2013 10:53 AM  Brittany Cunningham  has presented today for surgery, with the diagnosis of Twins breech, [phone]  The various methods of treatment have been discussed with the patient and family. After consideration of risks, benefits and other options for treatment, the patient has consented to  Procedure(s): CESAREAN SECTION/TWINS (N/A) as a surgical intervention .  The patient's history has been reviewed, patient examined, no change in status, stable for surgery.  I have reviewed the patient's chart and labs.  Questions were answered to the patient's satisfaction.     Tawona Filsinger D

## 2013-05-09 NOTE — Anesthesia Procedure Notes (Signed)

## 2013-05-09 NOTE — Lactation Note (Signed)
This note was copied from the chart of Brittany Cunningham. Lactation Consultation Note  Patient Name: Brittany Cunningham Wehrenberg [id number] Date: 05/09/2013 Reason for consult: Initial assessment;Difficult latch;Late preterm infant;Multiple gestation.  LC assisted mom first to attempt nursing "B" but she was spitty and reluctant to open mouth wide.  This twin, "A" is awake and rooting but unable to latch for sustained sucks without NS so tried #20 NS and after multiple attempts, he finally does achieve brief sustained latch and sucks for about 10 minutes and a large drop of colostrum was seen in tip of shield.  FOB helping with STS and holding but both parents are exhausted and will need more assistance with feedings tonight and in am.  Feedings reported to RN, Adela Lank and plan for both twins reviewed with RN and parents:  Mom to attempt breastfeeding both babies at least every 3 hours and if not able to latch for at least 10-15 minutes, she will pump for 10-15 minutes with DEBP and spoon feed any expressed colostrum.Mom was sleepy and feeling "warm" with each twin STS, so will need additional assistance and reinforcement of teaching.   LC provided [employer] brochure and reviewed [facility] services and list of community and web site resources.Parents encouraged to review Baby and Me pp 14 and 20-25 for STS and BF information.  Temporary use and cleaning of NS reviewed with both parents as well.   Maternal Data Formula Feeding for Exclusion: No Infant to breast within first hour of birth: No Has patient been taught Hand Expression?: Yes Does the patient have breastfeeding experience prior to this delivery?: Yes  Feeding Feeding Type: Breast Fed  LATCH Score/Interventions Latch: Repeated attempts needed to sustain latch, nipple held in mouth throughout feeding, stimulation needed to elicit sucking reflex. Intervention(s): Adjust position;Assist with latch;Breast massage;Breast compression  Audible  Swallowing: A few with stimulation (colostrum in tip of shield) Intervention(s): Skin to skin;Hand expression Intervention(s): Alternate breast massage  Type of Nipple: Everted at rest and after stimulation (nipples short and soft)  Comfort (Breast/Nipple): Soft / non-tender     Hold (Positioning): Full assist, staff holds infant at breast (mom too sleepy but trying to help as able) Intervention(s): Breastfeeding basics reviewed;Support Pillows;Position options;Skin to skin  LATCH Score: 6  Lactation Tools Discussed/Used Tools: Nipple Dorris Carnes;Pump Nipple shield size: 20 Breast pump type: Double-Electric Breast Pump Pump Review: Setup, frequency, and cleaning;Milk Storage Initiated by:: RN initiated; LC reinforced Date initiated:: 05/10/13   Consult Status Consult Status: Follow-up    Lynda Rainwater 05/09/2013, 9:50 PM

## 2013-05-09 NOTE — H&P (Signed)
Brittany Cunningham is a 27 y.o. female, G2 P29, EGA 37+ weeks with di/di twins with EDC 10-30 presenting for c-section.  Twins have been followed with MFM for discordant growth, discordant AFI levels with reactive NSTs, reassuring BPPs and normal UA dopplers.  AFV levels have equaled out.  Growth is still discordant and babies are breech/breech.  She received weekly 17-P for previous delivery at 36 weeks and received betamethasone in August due to growth discordance.  Pregnancy otherwise uncomplicated, see prenatal records for complete history.  Maternal Medical History:  Fetal activity: Perceived fetal activity is normal.      OB History   Grav Para Term Preterm Abortions TAB SAB Ect Mult Living   2 1 0 1 0 0 0 0 0 1     SVD at 36 weeks  Past Medical History  Diagnosis Date  . Thyroid disease     currently not treated with meds  . Fibromyalgia   . Headache(784.0)     migraines  . Anemia     borderline previous pregnancy  . Complication of anesthesia     heartrate dropped to 30's with epidural   Past Surgical History  Procedure Laterality Date  . No past surgeries    . Wisdom tooth extraction     Family History: family history includes Diabetes in her maternal grandmother; Hypertension in her father; Thyroid disease in her mother. Social History:  reports that she has never smoked. She does not have any smokeless tobacco history on file. She reports that she does not drink alcohol or use illicit drugs.   Prenatal Transfer Tool  Maternal Diabetes: No Genetic Screening: Declined Maternal Ultrasounds/Referrals: Normal Fetal Ultrasounds or other Referrals:  None Maternal Substance Abuse:  No Significant Maternal Medications:  Meds include: Progesterone Significant Maternal Lab Results:  Lab values include: Group B Strep positive Other Comments:  Discordant growth of di/di twins  Review of Systems  Respiratory: Negative.   Cardiovascular: Negative.       Last menstrual  period 08/19/2012. Maternal Exam:  Abdomen: Patient reports no abdominal tenderness. Fetal presentation: breech  Introitus: Normal vulva. Normal vagina.    Physical Exam  Constitutional: She appears well-developed and well-nourished.  Neck: Neck supple. No thyromegaly present.  Cardiovascular: Normal rate, regular rhythm and normal heart sounds.   No murmur heard. Respiratory: Effort normal and breath sounds normal. No respiratory distress. She has no wheezes.  GI: Soft.  gravid    Prenatal labs: ABO, Rh: --/--/B POS, B POS (10/10 1115) Antibody: NEG (10/10 1115) Rubella: Immune (04/09 0000) RPR: NON REACTIVE (10/10 1115)  HBsAg: Negative (04/09 0000)  HIV: Non-reactive (04/09 0000)  GBS:   positive GCT:  137, nl GTT  Assessment/Plan: Di/di twin IUP at 37+ weeks with breech/breech presentation and growth discordance.  C-section procedure and risks have been discussed, will proceed with c-section today.   Brittany Cunningham D 05/09/2013, 7:47 AM

## 2013-05-09 NOTE — Consult Note (Signed)
Neonatology Note:   Attendance at C-section:    I was asked by Dr. Jackelyn Knife to attend this primary C/S at 37 weeks due to twin gestation breech presentation. The mother is a G2P1 B pos, GBS positive with twin gestation complicated by discordant growth and polyhydramnios in Twin B see on ultrasound. The mother also has fibromyalgia. ROM at delivery, fluid clear.  Twin A, a boy, was delivered breech and was vigorous with good spontaneous cry and tone. Needed only minimal bulb suctioning. Ap 9/9. Lungs clear to ausc in DR. To CN to care of Pediatrician.  Twin B,, a girl, was also delivered breech. She was a little slow to cry initially and was dusky. Tone and HR were always normal. She was bulb suctioned and breath sounds were noted to be decreased bilaterally, so chest PT was done. After this, the baby had slightly improved air exchange, but was having mild subcostal retractions and occasional grunting. We placed a pulse oximeter on her and it showed the O2 saturation to be 79% in room air, so we gave BBO2 for about 5 min, then were able to withdraw it with her maintaining normal O2 saturations. Due to her mild respiratory distress, she was shown briefly to her parents, then was taken to the CN to complete transition and for close observation. Transferred to the care of the Pediatrician.   Doretha Sou, MD

## 2013-05-09 NOTE — Preoperative (Signed)
Beta Blockers   Reason not to administer Beta Blockers:Not Applicable 

## 2013-05-09 NOTE — Op Note (Signed)
Preoperative diagnosis: Intrauterine pregnancy at 37+ weeks, di/di twin pregnancy with growth discordance, breech/breech presentation Postoperative diagnosis: Same Procedure: Primary low transverse cesarean section without extensions Surgeon: Lavina Hamman M.D. Assistant:  Tracey Harries, MD Anesthesia: Spinal   Findings: Patient had normal gravid anatomy.  Baby A was a viable female, frank breech, Apgars 9 ,9, weight pending.  Baby B was a viable female, also frank breech, Apgars 7, 9, weight pending Estimated blood loss: 1000 cc Specimens: Placenta sentfor routine pathology Complications: None  Procedure in detail: The patient was taken to the operating room and placed in the sitting position. Dr. Cristela Blue instilled spinal anesthesia.  She was then placed in the dorsosupine position with left tilt. Abdomen was then prepped and draped in the usual sterile fashion, and a foley catheter was inserted. The level of her anesthesia was found to be adequate. Abdomen was entered via a standard Pfannenstiel incision. Once the peritoneal cavity was entered the Alexis disposable self-retaining retractor was placed and good visualization was achieved. A 4 cm transverse incision was then made in the lower uterine segment pushing the bladder inferior. Once the uterine cavity was entered the incision was extended digitally. Membranes ruptured revealing clear fluid.  The fetal breech was grasped and delivered through the incision atraumatically, easily followed by the torso, arms and head. Mouth and nares were suctioned. Cord was doubly clamped and cut and the infant handed to the awaiting pediatric team. Baby B was then also found to be frank breech.  Membranes ruptures with clear fluid.  The breech easily delivered, quickly followed by the torso, arms and head.  Mouth and nares were suctioned, cord doubly clamped and cut and baby handed to the awaiting pediatric team.  Cord blood was obtained. The placenta  delivered manually, trailing membranes were removed. Uterus was wiped dry with clean lap pad and all clots and debris were removed. Uterine incision was inspected and found to be free of extensions. Uterine incision was closed in 2 layers with running locking #1 Chromic for the first layer, an imbricating layer with #1 Chromic for the second layer. Tubes and ovaries were inspected and found to be normal. Uterine incision was inspected and found to be hemostatic. Bleeding from serosal edges was controlled with electrocautery. The Alexis retractor was removed. Subfascial space was irrigated and made hemostatic with electrocautery. Peritoneum was reapproximated with interrupted 0-Vicryl.  Fascia was closed in running fashion starting at both ends and meeting in the middle with 0 Vicryl. Subcutaneous tissue was then irrigated and made hemostatic with electrocautery, then closed with running 2-0 plain gut. Skin was closed with a running subcuticular stitch with 4-0 Vicryl followed by steri-strips and a sterile dressing. Patient tolerated the procedure well and was taken to the recovery in stable condition. Counts were correct x2, she received Ancef 2 g IV at the beginning of the procedure and she had PAS hose on throughout the procedure.

## 2013-05-09 NOTE — Anesthesia Preprocedure Evaluation (Addendum)
Anesthesia Evaluation  Patient identified by MRN, date of birth, ID band Patient awake    Reviewed: Allergy & Precautions, H&P , Patient's Chart, lab work & pertinent test results  Airway Mallampati: II TM Distance: >3 FB Neck ROM: full    Dental no notable dental hx.    Pulmonary  breath sounds clear to auscultation  Pulmonary exam normal       Cardiovascular Exercise Tolerance: Good Rhythm:regular Rate:Normal     Neuro/Psych    GI/Hepatic   Endo/Other  Morbid obesity  Renal/GU      Musculoskeletal   Abdominal   Peds  Hematology   Anesthesia Other Findings Glycopyrrolate preventatively for bradycardia Hemabate/Methergine in OR; risk for post op hemmorhage   Reproductive/Obstetrics                          Anesthesia Physical Anesthesia Plan  ASA: III  Anesthesia Plan: Spinal   Post-op Pain Management:    Induction:   Airway Management Planned:   Additional Equipment:   Intra-op Plan:   Post-operative Plan:   Informed Consent: I have reviewed the patients History and Physical, chart, labs and discussed the procedure including the risks, benefits and alternatives for the proposed anesthesia with the patient or authorized representative who has indicated his/her understanding and acceptance.   Dental Advisory Given  Plan Discussed with: CRNA  Anesthesia Plan Comments: (Lab work confirmed with CRNA in room. Platelets okay. Discussed spinal anesthetic, and patient consents to the procedure:  included risk of possible headache,backache, failed block, allergic reaction, and nerve injury. This patient was asked if she had any questions or concerns before the procedure started. )       Anesthesia Quick Evaluation

## 2013-05-10 ENCOUNTER — Encounter (HOSPITAL_COMMUNITY): Payer: Self-pay | Admitting: Obstetrics and Gynecology

## 2013-05-10 LAB — CBC
HCT: 26.9 % — ABNORMAL LOW (ref 36.0–46.0)
MCH: 28.8 pg (ref 26.0–34.0)
Platelets: 144 10*3/uL — ABNORMAL LOW (ref 150–400)
RBC: 3.16 MIL/uL — ABNORMAL LOW (ref 3.87–5.11)
RDW: 14.8 % (ref 11.5–15.5)
WBC: 9.2 10*3/uL (ref 4.0–10.5)

## 2013-05-10 NOTE — Progress Notes (Signed)
Subjective: Postpartum Day #1: Cesarean Delivery Patient reports incisional pain and tolerating PO.  Babies in room, doing ok.  Objective: Vital signs in last 24 hours: Temp:  [97.6 F (36.4 C)-98.6 F (37 C)] 98.2 F (36.8 C) (10/14 0626) Pulse Rate:  [84-118] 98 (10/14 0626) Resp:  [16-20] 18 (10/14 0626) BP: (115-143)/(69-96) 132/75 mmHg (10/14 0626) SpO2:  [97 %-100 %] 100 % (10/14 0626) Weight:  [102.513 kg (226 lb)] 102.513 kg (226 lb) (10/13 1345)  Physical Exam:  General: alert Lochia: appropriate Uterine Fundus: firm Incision: dressing intact   Recent Labs  05/10/13 0605  HGB 9.1*  HCT 26.9*    Assessment/Plan: Status post Cesarean section. Doing well postoperatively.  Continue current care.  Ambulate.  Discussed circumcision, will do tomorrow.  Kymere Fullington D 05/10/2013, 8:36 AM

## 2013-05-10 NOTE — Progress Notes (Signed)
Patient was referred for history of depression/anxiety. * Referral screened out by Clinical Social Worker because none of the following criteria appear to apply: ~ History of anxiety/depression during this pregnancy, or of post-partum depression. ~ Diagnosis of anxiety and/or depression within last 3 years ~ History of depression due to pregnancy loss/loss of child OR * Patient's symptoms currently being treated with medication and/or therapy.  PNR states Depression was "situational-resolved."  CSW screening out referral at this time.  Please contact the Clinical Social Worker if needs arise, or if patient requests. 

## 2013-05-11 NOTE — Progress Notes (Signed)
POD #2 Doing ok, ambulating, pain ok Afeb, VSS Abd- soft, fundus firm, dressing C/D/I Continue routine care

## 2013-05-11 NOTE — Lactation Note (Signed)
This note was copied from the chart of Girl[facility] Ronnie Pitre. Lactation Consultation Note  Patient Name: KEMBERLY TAVES Jacobsen [id number] Date: 05/11/2013 Reason for consult: Follow-up assessment;Infant < 6lbs;Late preterm infant;Multiple gestation    Mom continues attempting to breastfeed her twins but they are not latching well.  "A" (this twin) has had some 4 minute feedings at breast today with one LATCH score=8. Mom is feeding formula by bottle and pumping with DEBP but the pump is not functioning properly and mom states she will call LC when ready to pump so LC can assess pump function.  Later, mom called and pump replaced and is now working well on both breasts and obtaining a few ml's of colostrum.  LC reviewed milk storage and encouraged mom to continue breastfeeding attempts every 3 hours and post-pumping if unable to latch either twin for sustained 15-20 minute feeding.  Mom also can try allowing each twin a few sucks from bottle at a time "at breast" or may insert some ebm or formula into NS if unable to latch without shield.     Maternal Data    Feeding Feeding Type: Bottle Fed - Formula (encouraged to pump)  LATCH Score/Interventions         LATCH score of "8" today, per RN assessment but only sustained for 4 minutes             Lactation Tools Discussed/Used   DEBP NS Curved-tip syringe 13h feedings due to weight and preterm status  Consult Status Consult Status: Follow-up Date: 05/12/13 Follow-up type: In-patient    Warrick Parisian [facility] 05/11/2013, 9:26 PM

## 2013-05-12 ENCOUNTER — Encounter (HOSPITAL_COMMUNITY)
Admission: RE | Admit: 2013-05-12 | Discharge: 2013-05-12 | Disposition: A | Discharge status: Home / Self Care | Payer: BC Managed Care – PPO | Source: Ambulatory Visit | Attending: Obstetrics and Gynecology | Admitting: Obstetrics and Gynecology

## 2013-05-12 DIAGNOSIS — O923 Agalactia: Secondary | ICD-10-CM | POA: Insufficient documentation

## 2013-05-12 MED ORDER — IBUPROFEN 600 MG PO TABS: 600.0000 mg | ORAL_TABLET | Freq: Four times a day (QID) | ORAL | Status: DC

## 2013-05-12 MED ORDER — OXYCODONE-ACETAMINOPHEN 5-325 MG PO TABS: 1.0000 | ORAL_TABLET | ORAL | Status: DC | PRN

## 2013-05-12 NOTE — Discharge Summary (Signed)
Obstetric Discharge Summary Reason for Admission: cesarean section Prenatal Procedures: NST and ultrasound Intrapartum Procedures: cesarean: low cervical, transverse Postpartum Procedures: none Complications-Operative and Postpartum: none Hemoglobin  Date Value Range Status  05/10/2013 9.1* 12.0 - 15.0 g/dL Final     HCT  Date Value Range Status  05/10/2013 26.9* 36.0 - 46.0 % Final    Physical Exam:  General: alert Lochia: appropriate Uterine Fundus: firm Incision: healing well  Discharge Diagnoses: Twin pregnancy at 37+ weeks  Discharge Information: Date: 05/12/2013 Activity: pelvic rest and no strenuous activity Diet: routine Medications: Ibuprofen and Percocet Condition: stable Instructions: refer to practice specific booklet Discharge to: home Follow-up Information   Follow up with Nesanel Aguila D, MD. Schedule an appointment as soon as possible for a visit in 2 weeks.   Specialty:  Obstetrics and Gynecology   Contact information:   [address] [place] [address] [place] [address] [phone]       Newborn Data:   Adja, Ruff [[id number]]  Live born female  Birth Weight: 6 lb 11.9 oz (3060 g) APGAR: 9, 9   Alli, Jasmer [[id number]]  Live born female  Birth Weight: 5 lb 1 oz (2296 g) APGAR: 7, 9  Home with mother.  Matti Minney D 05/12/2013, 8:08 AM

## 2013-05-12 NOTE — Progress Notes (Signed)
POD #3 Doing ok, ready to go home Afeb, VSS Abd- soft, fundus firm, incision intact D/c home

## 2013-06-12 ENCOUNTER — Encounter (HOSPITAL_COMMUNITY)
Admission: RE | Admit: 2013-06-12 | Discharge: 2013-06-12 | Disposition: A | Discharge status: Home / Self Care | Payer: BC Managed Care – PPO | Source: Ambulatory Visit | Attending: Obstetrics and Gynecology | Admitting: Obstetrics and Gynecology

## 2013-06-12 DIAGNOSIS — O923 Agalactia: Secondary | ICD-10-CM | POA: Insufficient documentation

## 2013-11-29 ENCOUNTER — Emergency Department
Admission: EM | Admit: 2013-11-29 | Discharge: 2013-11-29 | Disposition: A | Discharge status: Home / Self Care | Payer: 59 | Source: Home / Self Care | Attending: Emergency Medicine | Admitting: Emergency Medicine

## 2013-11-29 ENCOUNTER — Encounter: Payer: Self-pay | Admitting: Emergency Medicine

## 2013-11-29 DIAGNOSIS — R51 Headache: Secondary | ICD-10-CM

## 2013-11-29 MED ORDER — KETOROLAC TROMETHAMINE 60 MG/2ML IM SOLN
60.0000 mg | Freq: Once | INTRAMUSCULAR | Status: AC
  Administered 2013-11-29: 60 mg via INTRAMUSCULAR

## 2013-11-29 MED ORDER — SUMATRIPTAN SUCCINATE 50 MG PO TABS
50.0000 mg | ORAL_TABLET | ORAL | Status: DC | PRN
  Administered 2013-11-29: 50 mg via ORAL

## 2013-11-29 MED ORDER — KETOROLAC TROMETHAMINE 60 MG/2ML IM SOLN: 60.0000 mg | Freq: Once | INTRAMUSCULAR | Status: DC

## 2013-11-29 NOTE — ED Provider Notes (Signed)
CSN: [id number]     Arrival date & time 11/29/13  1225 History   First MD Initiated Contact with Patient 11/29/13 1248     Chief Complaint  Patient presents with  . Headache   (Consider location/radiation/quality/duration/timing/severity/associated sxs/prior Treatment) HPI SUBJECTIVE:  Brittany Cunningham is a 28 y.o. female who complains of migraine headache for 2 day(s). She has a well established history of recurrent migraines.  Description of pain: squeezing pain, bilateral in the occipital area, unilateral in the right frontal area. Associated symptoms: flashing lights, light sensitivity and sound sensitivity. Patient has already taken Vicodin and Ibuprofen for this headache without relief.  Current Facility-Administered Medications  Medication Dose Route Frequency Provider Last Rate Last Dose  . ketorolac (TORADOL) injection 60 mg  60 mg Intramuscular Once Marlaine HindJeffrey H Henderson, MD      . SUMAtriptan (IMITREX) tablet 50 mg  50 mg Oral Q2H PRN Marlaine HindJeffrey H Henderson, MD   50 mg at 11/29/13 1312   No current outpatient prescriptions on file.    There are no associated abnormal neurological symptoms such as TIA's, loss of balance, loss of vision or speech, numbness or weakness on review. Past neurological history: negative for stroke, MS, epilepsy, or brain tumor.  She recently had in babies and is under more stress.  Prior to giving birth she had a long history of migraines but since then she is getting them about every week.  She has not gone to see any specialist for this.     Past Medical History  Diagnosis Date  . Thyroid disease     currently not treated with meds  . Fibromyalgia   . Headache(784.0)     migraines  . Anemia     borderline previous pregnancy  . Complication of anesthesia     heartrate dropped to 30's with epidural   Past Surgical History  Procedure Laterality Date  . No past surgeries    . Wisdom tooth extraction    . Cesarean section N/A 05/09/2013   Procedure: CESAREAN SECTION/TWINS;  Surgeon: Lavina Hammanodd Meisinger, MD;  Location: [facility];  Service: Obstetrics;  Laterality: N/A;   Family History  Problem Relation Age of Onset  . Thyroid disease Mother   . Hypertension Father   . Diabetes Maternal Grandmother    History  Substance Use Topics  . Smoking status: Never Smoker   . Smokeless tobacco: Not on file  . Alcohol Use: No   OB History   Grav Para Term Preterm Abortions TAB SAB Ect Mult Living   2 2 1 1  0 0 0 0 1 3     Review of Systems  All other systems reviewed and are negative.   Allergies  Review of patient's allergies indicates no known allergies.  Home Medications   Prior to Admission medications   Not on File   BP 129/85  Pulse 106  Resp 14  Wt 205 lb (92.987 kg)  SpO2 97%  LMP 11/09/2013 Physical Exam  Nursing note and vitals reviewed. Constitutional: She is oriented to person, place, and time. Vital signs are normal. She appears well-developed and well-nourished. She appears distressed (Photophobic).  HENT:  Head: Normocephalic and atraumatic. Head is without contusion.  Right Ear: Hearing, tympanic membrane and ear canal normal.  Left Ear: Hearing, tympanic membrane and ear canal normal.  Nose: Nose normal.  Mouth/Throat: Oropharynx is clear and moist and mucous membranes are normal.  Eyes: EOM are normal. Pupils are equal, round, and reactive to light. No  scleral icterus.  Neck: Full passive range of motion without pain and phonation normal. Neck supple. No spinous process tenderness and no muscular tenderness present. Carotid bruit is not present. No rigidity. Normal range of motion present. No Brudzinski's sign and no Kernig's sign noted.  Cardiovascular: Normal rate, regular rhythm and normal heart sounds.   Pulmonary/Chest: Effort normal and breath sounds normal. No respiratory distress. She has no decreased breath sounds. She has no wheezes.  Neurological: She is alert and oriented to person, place,  and time. She has normal strength and normal reflexes. No cranial nerve deficit or sensory deficit. Gait normal. GCS eye subscore is 4. GCS verbal subscore is 5. GCS motor subscore is 6.  Skin: Skin is warm and dry.  Psychiatric: She has a normal mood and affect. Her speech is normal and behavior is normal. Thought content normal.    ED Course  Procedures (including critical care time) Labs Review Labs Reviewed - No data to display  Imaging Review No results found.   MDM   1. Headache   This patient most likely with migraine headaches.  Will give Toradol 60 mg IM x1 clinic.  Also will give Imitrex x2 tablets of 50 mg each.  No signs of intracranial or neurologic issues.  Encourage cool compresses.  Will refer her to women's health to a headache specialist to see if they can help since she is having her headaches much more often since she has had children and likely needs to be evaluated and followed.  ER precautions given for worsening headaches or neurologic symptoms.  Will be driven home now to go to sleep.  Marlaine HindJeffrey H Henderson, MD 11/29/13 [phone]

## 2013-11-29 NOTE — ED Notes (Signed)
Brittany BumpsJessica c/o right sided HA x 3 days with light and noise sensitivity. C/o nausea without vomiting. Reports she has been getting HAs since giving birth 7 months ago. Pain starts @ right eye and progresses towards back of her head.

## 2014-04-08 ENCOUNTER — Emergency Department
Admission: EM | Admit: 2014-04-08 | Discharge: 2014-04-08 | Disposition: A | Discharge status: Home / Self Care | Payer: 59 | Source: Home / Self Care

## 2014-04-08 ENCOUNTER — Encounter: Payer: Self-pay | Admitting: Emergency Medicine

## 2014-04-08 DIAGNOSIS — G43909 Migraine, unspecified, not intractable, without status migrainosus: Secondary | ICD-10-CM

## 2014-04-08 MED ORDER — PROMETHAZINE HCL 25 MG PO TABS: 25.0000 mg | ORAL_TABLET | Freq: Four times a day (QID) | ORAL | Status: DC | PRN

## 2014-04-08 MED ORDER — KETOROLAC TROMETHAMINE 60 MG/2ML IM SOLN
60.0000 mg | Freq: Once | INTRAMUSCULAR | Status: AC
  Administered 2014-04-08: 60 mg via INTRAMUSCULAR

## 2014-04-08 MED ORDER — METHYLPREDNISOLONE ACETATE 40 MG/ML IJ SUSP
40.0000 mg | Freq: Once | INTRAMUSCULAR | Status: AC
  Administered 2014-04-08: 40 mg via INTRAMUSCULAR

## 2014-04-08 NOTE — Discharge Instructions (Signed)
Thank you for coming in today. Take Phenergan.  Tomorrow morning you can take up to 2 Aleve twice daily for headache.  Followup with your primary care provider.  Go to the emergency room if your headache becomes excruciating or you have weakness or numbness or uncontrolled vomiting.    Migraine Headache A migraine headache is an intense, throbbing pain on one or both sides of your head. A migraine can last for 30 minutes to several hours. CAUSES  The exact cause of a migraine headache is not always known. However, a migraine may be caused when nerves in the brain become irritated and release chemicals that cause inflammation. This causes pain. Certain things may also trigger migraines, such as:  Alcohol.  Smoking.  Stress.  Menstruation.  Aged cheeses.  Foods or drinks that contain nitrates, glutamate, aspartame, or tyramine.  Lack of sleep.  Chocolate.  Caffeine.  Hunger.  Physical exertion.  Fatigue.  Medicines used to treat chest pain (nitroglycerine), birth control pills, estrogen, and some blood pressure medicines. SIGNS AND SYMPTOMS  Pain on one or both sides of your head.  Pulsating or throbbing pain.  Severe pain that prevents daily activities.  Pain that is aggravated by any physical activity.  Nausea, vomiting, or both.  Dizziness.  Pain with exposure to bright lights, loud noises, or activity.  General sensitivity to bright lights, loud noises, or smells. Before you get a migraine, you may get warning signs that a migraine is coming (aura). An aura may include:  Seeing flashing lights.  Seeing bright spots, halos, or zigzag lines.  Having tunnel vision or blurred vision.  Having feelings of numbness or tingling.  Having trouble talking.  Having muscle weakness. DIAGNOSIS  A migraine headache is often diagnosed based on:  Symptoms.  Physical exam.  A CT scan or MRI of your head. These imaging tests cannot diagnose migraines, but  they can help rule out other causes of headaches. TREATMENT Medicines may be given for pain and nausea. Medicines can also be given to help prevent recurrent migraines.  HOME CARE INSTRUCTIONS  Only take over-the-counter or prescription medicines for pain or discomfort as directed by your health care provider. The use of long-term narcotics is not recommended.  Lie down in a dark, quiet room when you have a migraine.  Keep a journal to find out what may trigger your migraine headaches. For example, write down:  What you eat and drink.  How much sleep you get.  Any change to your diet or medicines.  Limit alcohol consumption.  Quit smoking if you smoke.  Get 7-9 hours of sleep, or as recommended by your health care provider.  Limit stress.  Keep lights dim if bright lights bother you and make your migraines worse. SEEK IMMEDIATE MEDICAL CARE IF:   Your migraine becomes severe.  You have a fever.  You have a stiff neck.  You have vision loss.  You have muscular weakness or loss of muscle control.  You start losing your balance or have trouble walking.  You feel faint or pass out.  You have severe symptoms that are different from your first symptoms. MAKE SURE YOU:   Understand these instructions.  Will watch your condition.  Will get help right away if you are not doing well or get worse. Document Released: 07/14/2005 Document Revised: 11/28/2013 Document Reviewed: 03/21/2013 [facility] Patient Information 2015 [place], [place]. This information is not intended to replace advice given to you by your health care provider. Make  sure you discuss any questions you have with your health care provider.

## 2014-04-08 NOTE — ED Notes (Signed)
Reports headache since 3 days ago; history of migraines; out of her prn Topomax.

## 2014-04-08 NOTE — ED Provider Notes (Signed)
Brittany Cunningham is a 28 y.o. female who presents to Urgent Care today for headache. Patient's had a typical migraine headache for the last 4 days. She has a history of frequent migraines currently taking Topamax. She was out of the medicine for a few days. She notes severe left-sided pounding headache. Her headache is consistent with prior migraines. She's tried methocarbamol which has not helped.   Past Medical History  Diagnosis Date  . Thyroid disease     currently not treated with meds  . Fibromyalgia   . Headache(784.0)     migraines  . Anemia     borderline previous pregnancy  . Complication of anesthesia     heartrate dropped to 30's with epidural   History  Substance Use Topics  . Smoking status: Never Smoker   . Smokeless tobacco: Not on file  . Alcohol Use: No   ROS as above Medications: Current Facility-Administered Medications  Medication Dose Route Frequency Provider Last Rate Last Dose  . ketorolac (TORADOL) injection 60 mg  60 mg Intramuscular Once Rodolph Bong, MD      . methylPREDNISolone acetate (DEPO-MEDROL) injection 40 mg  40 mg Intramuscular Once Rodolph Bong, MD       Current Outpatient Prescriptions  Medication Sig Dispense Refill  . norethindrone-ethinyl estradiol-iron (ESTROSTEP FE,TILIA FE,TRI-LEGEST FE) 1-20/1-30/1-35 MG-MCG tablet Take 1 tablet by mouth daily.      Marland Kitchen topiramate (TOPAMAX) 100 MG tablet Take 100 mg by mouth 2 (two) times daily.        Exam:  BP 133/93  Pulse 115  Temp(Src) 98.2 F (36.8 C) (Oral)  Resp 16  Ht  (1.6 m)  Wt 204 lb (92.534 kg)  BMI 36.15 kg/m2  SpO2 98% Gen: Well NAD HEENT: EOMI,  MMM PERRLA Lungs: Normal work of breathing. CTABL Heart: RRR no MRG Abd: NABS, Soft. Nondistended, Nontender Exts: Brisk capillary refill, warm and well perfused.  Neuro: Alert and oriented normal coordination gait reflexes and strength. Normal speech and thought process.  Patient was given 60 mg of Toradol and 40 mg of  Depo-Medrol intramuscularly.  No results found for this or any previous visit (from the past 24 hour(s)). No results found.  Assessment and Plan: 28 y.o. female with migraine headache. Plan to prescribe Phenergan.patient will resume Topamax and followup with primary care provider. Recommend referral to clinic as needed   Discussed warning signs or symptoms. Please see discharge instructions. Patient expresses understanding.   This note was created using Conservation officer, historic buildings. Any transcription errors are unintended.    Rodolph Bong, MD 04/08/14 [phone]

## 2014-04-10 ENCOUNTER — Telehealth: Payer: Self-pay | Admitting: Emergency Medicine

## 2014-05-29 ENCOUNTER — Encounter: Payer: Self-pay | Admitting: Emergency Medicine

## 2014-09-19 ENCOUNTER — Encounter: Payer: Self-pay | Admitting: *Deleted

## 2014-09-19 ENCOUNTER — Emergency Department (INDEPENDENT_AMBULATORY_CARE_PROVIDER_SITE_OTHER)
Admission: EM | Admit: 2014-09-19 | Discharge: 2014-09-19 | Disposition: A | Discharge status: Home / Self Care | Payer: 59 | Source: Home / Self Care | Attending: Emergency Medicine | Admitting: Emergency Medicine

## 2014-09-19 DIAGNOSIS — G43101 Migraine with aura, not intractable, with status migrainosus: Secondary | ICD-10-CM | POA: Diagnosis not present

## 2014-09-19 HISTORY — DX: Irritable bowel syndrome, unspecified: K58.9

## 2014-09-19 MED ORDER — SUMATRIPTAN SUCCINATE 6 MG/0.5ML ~~LOC~~ SOLN
6.0000 mg | Freq: Once | SUBCUTANEOUS | Status: AC
  Administered 2014-09-19: 6 mg via SUBCUTANEOUS

## 2014-09-19 MED ORDER — METHYLPREDNISOLONE ACETATE 80 MG/ML IJ SUSP
80.0000 mg | Freq: Once | INTRAMUSCULAR | Status: AC
  Administered 2014-09-19: 80 mg via INTRAMUSCULAR

## 2014-09-19 MED ORDER — KETOROLAC TROMETHAMINE 60 MG/2ML IM SOLN
60.0000 mg | Freq: Once | INTRAMUSCULAR | Status: AC
  Administered 2014-09-19: 60 mg via INTRAMUSCULAR

## 2014-09-19 NOTE — ED Notes (Signed)
Pt c/o migraine x 1000 yesterday. She has taken baclofen and Phenergan yesterday with no relief.

## 2014-09-19 NOTE — ED Provider Notes (Addendum)
CSN: [id number]     Arrival date & time 09/19/14  1331 History   First MD Initiated Contact with Patient 09/19/14 1334     Chief Complaint  Patient presents with  . Headache    Patient is a 29 y.o. female presenting with headaches. The history is provided by the patient.  Headache Brittany Cunningham is a 29 y.o. female who [facility] for migraine headache. Patient's had a typical migraine headache for 1 day, onset 10 AM yesterday. She has a history of frequent migraines, diagnosed and followed by Dr. Catalina LungerHagan, her neurologist. She is currently taking Topamax 100 mg daily as prophylaxis.   She notes severe left-sided pounding headache. Denies focal neurologic symptoms or visual problems or syncope. Her headache is consistent with prior migraines. She has photophobia and phonophobia.  She's tried baclofen and Phenergan which has not helped. Has mild nausea but no vomiting. She feels the trigger may have been her menstrual period, as she is currently on her menstrual period. On oral contraceptives. Denies chance of pregnancy. Denies chest pain or abdominal pain. No fever or chills. I questioned her further regarding what treatment has worked well in the past. She states shot of Depo-Medrol has worked in the past, when combined with Toradol. I questioned her further regarding Imitrex, and she denies ever having had a trial of Imitrex subcutaneous during an acute migraine. Past Medical History  Diagnosis Date  . Thyroid disease     currently not treated with meds  . Fibromyalgia   . Headache(784.0)     migraines  . Anemia     borderline previous pregnancy  . Complication of anesthesia     heartrate dropped to 30's with epidural  . IBS (irritable bowel syndrome)    Past Surgical History  Procedure Laterality Date  . No past surgeries    . Wisdom tooth extraction    . Cesarean section N/A 05/09/2013    Procedure: CESAREAN SECTION/TWINS;  Surgeon: Lavina Hammanodd  Meisinger, MD;  Location: [facility];  Service: Obstetrics;  Laterality: N/A;   Family History  Problem Relation Age of Onset  . Thyroid disease Mother   . Hypertension Father   . Diabetes Maternal Grandmother   . Thyroid disease Sister    History  Substance Use Topics  . Smoking status: Never Smoker   . Smokeless tobacco: Not on file  . Alcohol Use: No   OB History    Gravida Para Term Preterm AB TAB SAB Ectopic Multiple Living   2 2 1 1  0 0 0 0 1 3     Review of Systems  Neurological: Positive for headaches.  All other systems reviewed and are negative.   Allergies  Review of patient's allergies indicates no known allergies.  Home Medications   Prior to Admission medications   Medication Sig Start Date End Date Taking? Authorizing Provider  norethindrone-ethinyl estradiol-iron (ESTROSTEP FE,TILIA FE,TRI-LEGEST FE) 1-20/1-30/1-35 MG-MCG tablet Take 1 tablet by mouth daily.    Historical Provider, MD  promethazine (PHENERGAN) 25 MG tablet Take 1 tablet (25 mg total) by mouth every 6 (six) hours as needed for nausea or vomiting. 04/08/14   Rodolph BongEvan S Corey, MD  topiramate (TOPAMAX) 100 MG tablet Take 100 mg by mouth 2 (two) times daily.    Historical Provider, MD   BP 146/91 mmHg  Pulse 96  Temp(Src) 98.2 F (36.8 C) (Oral)  Resp 18  Ht 5\' 3"  (1.6 m)  Wt 192 lb (87.091 kg)  BMI 34.02 kg/m2  SpO2 97%  LMP 09/18/2014 Physical Exam  Constitutional: She appears well-developed and well-nourished. She appears distressed (Uncomfortable from headache.).  HENT:  Head: Normocephalic and atraumatic. Head is without raccoon's eyes, without contusion, without right periorbital erythema and without left periorbital erythema.  Right Ear: External ear and ear canal normal. No drainage. Tympanic membrane is not erythematous.  Left Ear: External ear and ear canal normal. No drainage. Tympanic membrane is not erythematous.  Nose: Nose normal. Right sinus exhibits no maxillary sinus tenderness.  Left sinus exhibits no maxillary sinus tenderness.  Mouth/Throat: Oropharynx is clear and moist and mucous membranes are normal. No oral lesions. No dental abscesses. No oropharyngeal exudate, posterior oropharyngeal edema or posterior oropharyngeal erythema.      No cranial bruits.  Normal temporal arteries.  Eyes: Conjunctivae, EOM and lids are normal. Pupils are equal, round, and reactive to light.  Fundoscopic exam:      The right eye shows no exudate, no hemorrhage and no papilledema.       The left eye shows no exudate, no hemorrhage and no papilledema.  Neck: Neck supple. Normal carotid pulses and no JVD present. Carotid bruit is not present. No tracheal deviation present. No thyromegaly present.  Cardiovascular: Regular rhythm and normal heart sounds.   Pulmonary/Chest: Effort normal and breath sounds normal. No respiratory distress. She has no wheezes. She has no rales.  Abdominal: Soft. There is no tenderness.  Musculoskeletal: She exhibits no edema or tenderness.  Neurological: She is alert. She has normal strength and normal reflexes. No cranial nerve deficit or sensory deficit. Coordination normal.  Reflex Scores:      Tricep reflexes are 2+ on the right side and 2+ on the left side.      Bicep reflexes are 2+ on the right side and 2+ on the left side.      Brachioradialis reflexes are 2+ on the right side and 2+ on the left side.      Patellar reflexes are 2+ on the right side and 2+ on the left side.      Achilles reflexes are 2+ on the right side and 2+ on the left side. Skin: Skin is warm and dry. No rash noted.  Psychiatric: She has a normal mood and affect.  Nursing note and vitals reviewed.   ED Course  Procedures (including critical care time) Labs Review Labs Reviewed - No data to display  Imaging Review No results found. 2:15 PM. Risk benefits alternatives, and treatment options discussed.  Patient prefers to try Imitrex subcutaneous before other treatment .   Imitrex 6 mg subcutaneous ordered. Patient agrees.  MDM   1. Migraine with aura and with status migrainosus, not intractable    2:35 PM. No significant improvement in migraine headache after Imitrex was given SQ about 20 minutes ago. Treatment options discussed again, as well as risks, benefits, alternatives. Patient voiced understanding and agreement with the following plans: Depo-Medrol 80 mg IM and Toradol 60 mg IM stat Phenergan Q6 hours by mouth when necessary nausea.  Other symptomatic care discussed. Other advice given. Follow-up with Neurologist or PCP within one week. Precautions discussed. Red flags discussed.--Emergency room if any red flag Questions invited and answered. Patient voiced understanding and agreement.   Lajean Manes, MD 09/19/14 1440  Lajean Manes, MD 09/19/14 [phone]

## 2017-04-27 ENCOUNTER — Emergency Department
Admission: EM | Admit: 2017-04-27 | Discharge: 2017-04-27 | Disposition: A | Discharge status: Home / Self Care | Payer: BLUE CROSS/BLUE SHIELD | Source: Home / Self Care | Attending: Family Medicine | Admitting: Family Medicine

## 2017-04-27 ENCOUNTER — Encounter: Payer: Self-pay | Admitting: Emergency Medicine

## 2017-04-27 DIAGNOSIS — J069 Acute upper respiratory infection, unspecified: Secondary | ICD-10-CM | POA: Diagnosis not present

## 2017-04-27 DIAGNOSIS — R0789 Other chest pain: Secondary | ICD-10-CM

## 2017-04-27 HISTORY — DX: Depression, unspecified: F32.A

## 2017-04-27 HISTORY — DX: Major depressive disorder, single episode, unspecified: F32.9

## 2017-04-27 MED ORDER — FLUCONAZOLE 200 MG PO TABS
200.0000 mg | ORAL_TABLET | Freq: Once | ORAL | 0 refills | Status: AC
Start: 2017-04-27 — End: 2017-04-27

## 2017-04-27 MED ORDER — ALBUTEROL SULFATE HFA 108 (90 BASE) MCG/ACT IN AERS: 1.0000 | INHALATION_SPRAY | Freq: Four times a day (QID) | RESPIRATORY_TRACT | 0 refills | Status: DC | PRN

## 2017-04-27 MED ORDER — AZITHROMYCIN 250 MG PO TABS: 250.0000 mg | ORAL_TABLET | Freq: Every day | ORAL | 0 refills | Status: DC

## 2017-04-27 MED ORDER — BENZONATATE 100 MG PO CAPS: 100.0000 mg | ORAL_CAPSULE | Freq: Three times a day (TID) | ORAL | 0 refills | Status: DC

## 2017-04-27 NOTE — ED Triage Notes (Signed)
Patient states she has had a frequent cough with some wheezing over past 5 days. Has recently started on anti-depressant.

## 2017-04-27 NOTE — ED Provider Notes (Addendum)
[facility]    CSN: [id number] Arrival date & time: 04/27/17  1907     History   Chief Complaint Chief Complaint  Patient presents with  . Cough  . Wheezing    HPI Brittany Cunningham is a 31 y.o. female.   HPI  Brittany Cunningham is a 31 y.o. female presenting to UC with c/o gradually worsening dry hacking cough, minimally productive at times, associated chest tightness and wheeze over the last 5 days.  She notes her son was recently dx with walking pneumonia.  She has tried OTC Delsym and Nyquil with mild relief.  She did try her son's albuterol inhaler with moderate relief.  She does not have her own inhaler, no hx of asthma.  Cough is worse at night. Denies fever, chills, n/v/d. She also recently started a new anti-depressant medication. She has had prednisone in the past, she cannot recall why but she notes it made her very moody and she would prefer not to take it if she doesn't have to.    Past Medical History:  Diagnosis Date  . Anemia    borderline previous pregnancy  . Complication of anesthesia    heartrate dropped to 30's with epidural  . Depression   . Fibromyalgia   . Headache(784.0)    migraines  . IBS (irritable bowel syndrome)   . Thyroid disease    currently not treated with meds    Patient Active Problem List   Diagnosis Date Noted  . Twin pregnancy w antenatal problem 05/09/2013  . PTSD, Depressed mood with anxious features. 06/07/2012  . Preventive measure 06/07/2012  . Headache(784.0) 06/07/2012    Past Surgical History:  Procedure Laterality Date  . CESAREAN SECTION N/A 05/09/2013   Procedure: CESAREAN SECTION/TWINS;  Surgeon: Lavina Hamman, MD;  Location: [facility];  Service: Obstetrics;  Laterality: N/A;  . NO PAST SURGERIES    . WISDOM TOOTH EXTRACTION      OB History    Gravida Para Term Preterm AB Living   0 3   SAB TAB Ectopic Multiple Live Births   0 0 0 1 2       Home Medications    Prior to Admission  medications   Medication Sig Start Date End Date Taking? Authorizing Provider  vortioxetine HBr (TRINTELLIX) 10 MG TABS Take by mouth.   Yes [provider]  albuterol (PROVENTIL HFA;VENTOLIN HFA) 108 (90 Base) MCG/ACT inhaler Inhale 1-2 puffs into the lungs every 6 (six) hours as needed for wheezing or shortness of breath. 04/27/17   Lurene Shadow, PA-C  azithromycin (ZITHROMAX) 250 MG tablet Take 1 tablet (250 mg total) by mouth daily. Take first 2 tablets together, then 1 every day until finished. 04/27/17   Lurene Shadow, PA-C  benzonatate (TESSALON) 100 MG capsule Take 1-2 capsules (100-200 mg total) by mouth every 8 (eight) hours. 04/27/17   Lurene Shadow, PA-C  fluconazole (DIFLUCAN) 200 MG tablet Take 1 tablet (200 mg total) by mouth once. May repeat in 3 days if still having symptoms 04/27/17 04/27/17  Lurene Shadow, PA-C  norethindrone-ethinyl estradiol-iron (ESTROSTEP FE,TILIA FE,TRI-LEGEST FE) 1-20/1-30/1-35 MG-MCG tablet Take 1 tablet by mouth daily.    [provider]  promethazine (PHENERGAN) 25 MG tablet Take 1 tablet (25 mg total) by mouth every 6 (six) hours as needed for nausea or vomiting. 04/08/14   Rodolph Bong, MD  topiramate (TOPAMAX) 100 MG tablet Take 100 mg by  mouth 2 (two) times daily.    [provider]    Family History Family History  Problem Relation Age of Onset  . Thyroid disease Mother   . Hypertension Father   . Diabetes Maternal Grandmother   . Thyroid disease Sister     Social History Social History  Substance Use Topics  . Smoking status: Never Smoker  . Smokeless tobacco: Never Used  . Alcohol use No     Allergies   Patient has no known allergies.   Review of Systems Review of Systems  Constitutional: Negative for chills and fever.  HENT: Positive for congestion and sore throat (mild). Negative for ear pain, trouble swallowing and voice change.   Respiratory: Positive for cough, chest tightness and wheezing.  Negative for shortness of breath.   Cardiovascular: Negative for chest pain and palpitations.  Gastrointestinal: Negative for abdominal pain, diarrhea, nausea and vomiting.  Musculoskeletal: Negative for arthralgias, back pain and myalgias.  Skin: Negative for rash.     Physical Exam Triage Vital Signs ED Triage Vitals  Enc Vitals Group     BP 04/27/17 1927 137/83     Pulse Rate 04/27/17 1927 (!) 102     Resp 04/27/17 1927 16     Temp 04/27/17 1927 98.6 F (37 C)     Temp Source 04/27/17 1927 Oral     SpO2 04/27/17 1927 98 %     Weight 04/27/17 1928 198 lb (89.8 kg)     Height 04/27/17 1928  (1.6 m)     Head Circumference --      Peak Flow --      Pain Score --      Pain Loc --      Pain Edu? --      Excl. in GC? --    No data found.   Updated Vital Signs BP 137/83 (BP Location: Left Arm)   Pulse (!) 102   Temp 98.6 F (37 C) (Oral)   Resp 16   Ht  (1.6 m)   Wt 198 lb (89.8 kg)   LMP 04/14/2017 (Exact Date)   SpO2 98%   BMI 35.07 kg/m      Physical Exam  Constitutional: She is oriented to person, place, and time. She appears well-developed and well-nourished. No distress.  HENT:  Head: Normocephalic and atraumatic.  Right Ear: Tympanic membrane normal.  Left Ear: Tympanic membrane normal.  Nose: Nose normal.  Mouth/Throat: Uvula is midline, oropharynx is clear and moist and mucous membranes are normal.  Eyes: EOM are normal.  Neck: Normal range of motion. Neck supple.  Cardiovascular: Normal rate and regular rhythm.   Pulmonary/Chest: Effort normal. No stridor. No respiratory distress. She has wheezes ( faint diffuse, expiratory). She has no rales.  Musculoskeletal: Normal range of motion.  Lymphadenopathy:    She has no cervical adenopathy.  Neurological: She is alert and oriented to person, place, and time.  Skin: Skin is warm and dry. She is not diaphoretic.  Psychiatric: She has a normal mood and affect. Her behavior is normal.  Nursing  note and vitals reviewed.    UC Treatments / Results  Labs (all labs ordered are listed, but only abnormal results are displayed) Labs Reviewed - No data to display  EKG  EKG Interpretation None       Radiology No results found.  Procedures Procedures (including critical care time)  Medications Ordered in UC Medications - No data to display   Initial Impression /  Assessment and Plan / UC Course  I have reviewed the triage vital signs and the nursing notes.  Pertinent labs & imaging results that were available during my care of the patient were reviewed by me and considered in my medical decision making (see chart for details).     Hx and exam c/w URI, due to worsening symptoms, will cover for atypical bacteria, however, did encouraged symptomatic treatment first with Tessalon and Albuterol inhaler. Prescription for Diflucan also prescribed, pt notes she "always" gets yeast infections when taking antibiotics.  F/u with PCP in 1 week if needed.   Final Clinical Impressions(s) / UC Diagnoses   Final diagnoses:  Upper respiratory tract infection, unspecified type  Chest tightness    New Prescriptions New Prescriptions   ALBUTEROL (PROVENTIL HFA;VENTOLIN HFA) 108 (90 BASE) MCG/ACT INHALER    Inhale 1-2 puffs into the lungs every 6 (six) hours as needed for wheezing or shortness of breath.   AZITHROMYCIN (ZITHROMAX) 250 MG TABLET    Take 1 tablet (250 mg total) by mouth daily. Take first 2 tablets together, then 1 every day until finished.   BENZONATATE (TESSALON) 100 MG CAPSULE    Take 1-2 capsules (100-200 mg total) by mouth every 8 (eight) hours.   FLUCONAZOLE (DIFLUCAN) 200 MG TABLET    Take 1 tablet (200 mg total) by mouth once. May repeat in 3 days if still having symptoms     Controlled Substance Prescriptions [place] Controlled Substance Registry consulted? Not Applicable   Rolla Plate 04/27/17 1941    Lurene Shadow, PA-C 04/27/17 1941

## 2017-04-27 NOTE — Discharge Instructions (Signed)
°  You may take 500mg acetaminophen every 4-6 hours or in combination with ibuprofen 400-600mg every 6-8 hours as needed for pain, inflammation, and fever. ° °Be sure to drink at least eight 8oz glasses of water to stay well hydrated and get at least 8 hours of sleep at night, preferably more while sick.  ° °

## 2017-04-30 ENCOUNTER — Telehealth: Payer: Self-pay | Admitting: Emergency Medicine

## 2017-04-30 NOTE — Telephone Encounter (Signed)
She is getting better, cough is improving

## 2017-11-09 ENCOUNTER — Emergency Department
Admission: EM | Admit: 2017-11-09 | Discharge: 2017-11-09 | Disposition: A | Discharge status: Home / Self Care | Payer: BLUE CROSS/BLUE SHIELD | Source: Home / Self Care | Attending: Family Medicine | Admitting: Family Medicine

## 2017-11-09 ENCOUNTER — Other Ambulatory Visit: Payer: Self-pay

## 2017-11-09 DIAGNOSIS — R197 Diarrhea, unspecified: Secondary | ICD-10-CM

## 2017-11-09 DIAGNOSIS — B029 Zoster without complications: Secondary | ICD-10-CM | POA: Diagnosis not present

## 2017-11-09 MED ORDER — VALACYCLOVIR HCL 1 G PO TABS: 1000.0000 mg | ORAL_TABLET | Freq: Three times a day (TID) | ORAL | 0 refills | Status: DC

## 2017-11-09 NOTE — Discharge Instructions (Addendum)
May take Ibuprofen 200mg , 4 tabs every 8 hours with food.  Begin clear liquids (Use electrolyte solution while having diarrhea) until improved, then advance to a SUPERVALU INCBRAT diet (Bananas, Rice, Applesauce, Toast).  Then gradually resume a regular diet when tolerated.  Avoid milk products until well.  To decrease diarrhea, mix one teaspoon Citrucel (methylcellulose) in 2 oz water and drink one to three times daily.  Do not drink extra fluids with this dose and do not drink fluids for one hour afterwards.   May Zostrix Cream (capsaicin) to rash to decrease pain. Recommend Shingrix vaccine when well.

## 2017-11-09 NOTE — ED Triage Notes (Signed)
Pt started having burning and itching on right midsection about 1 week ago.  Rash for the last 3-5 days.  Diarrhea, chills and aches along with sx.

## 2017-11-09 NOTE — ED Provider Notes (Signed)
[facility]    CSN: [id number] Arrival date & time: 11/09/17  1233     History   Chief Complaint Chief Complaint  Patient presents with  . Rash  . Diarrhea    HPI Brittany Cunningham is a 32 y.o. female.   Patient developed soreness in her right back and flank about one week ago and thought that she had pulled a muscle.  Four days ago she developed a painful burning/stabbing rash on her right abdomen that has persisted.  About one week ago she also developed watery diarrhea and occasional nausea (but no vomiting), and chills and myalgias.  She notes that she has been having about three loose stools per day.  She admits that she has a history of IBS, although it has been quiescent for some time.  She denies recent foreign travel, or drinking untreated water in a wilderness environment.  She denies recent antibiotic use.   The history is provided by the patient.  Rash  Location: right abdomen. Quality: burning, dryness, itchiness, painful and redness   Quality: not blistering, not bruising, not draining, not peeling, not scaling, not swelling and not weeping   Pain details:    Quality:  Aching, burning and stinging   Severity:  Moderate   Onset quality:  Gradual   Duration:  4 days   Timing:  Constant   Progression:  Unchanged Severity:  Moderate Onset quality:  Sudden Duration:  4 days Timing:  Constant Progression:  Unchanged Chronicity:  New Context: not animal contact, not chemical exposure, not exposure to similar rash, not food, not hot tub use, not insect bite/sting, not medications, not new detergent/soap, not nuts, not plant contact, not pregnancy and not sick contacts   Relieved by:  Nothing Worsened by:  Contact Ineffective treatments:  Anti-itch cream and topical steroids Associated symptoms: diarrhea, fatigue, fever, myalgias and nausea   Associated symptoms: no abdominal pain, no headaches, no induration, no joint pain, no sore throat, no URI and  not vomiting   Diarrhea  Associated symptoms: chills, fever and myalgias   Associated symptoms: no abdominal pain, no arthralgias, no headaches and no vomiting     Past Medical History:  Diagnosis Date  . Anemia    borderline previous pregnancy  . Complication of anesthesia    heartrate dropped to 30's with epidural  . Depression   . Fibromyalgia   . Headache(784.0)    migraines  . IBS (irritable bowel syndrome)   . Thyroid disease    currently not treated with meds    Patient Active Problem List   Diagnosis Date Noted  . Twin pregnancy w antenatal problem 05/09/2013  . PTSD, Depressed mood with anxious features. 06/07/2012  . Preventive measure 06/07/2012  . Headache(784.0) 06/07/2012    Past Surgical History:  Procedure Laterality Date  . CESAREAN SECTION N/A 05/09/2013   Procedure: CESAREAN SECTION/TWINS;  Surgeon: Lavina Hamman, MD;  Location: [facility];  Service: Obstetrics;  Laterality: N/A;  . NO PAST SURGERIES    . WISDOM TOOTH EXTRACTION      OB History    Gravida  2   Para  2   Term  1   Preterm  1   AB  0   Living  3     SAB  0   TAB  0   Ectopic  0   Multiple  1   Live Births  2  Home Medications    Prior to Admission medications   Medication Sig Start Date End Date Taking? Authorizing Provider  albuterol (PROVENTIL HFA;VENTOLIN HFA) 108 (90 Base) MCG/ACT inhaler Inhale 1-2 puffs into the lungs every 6 (six) hours as needed for wheezing or shortness of breath. 04/27/17   Lurene Shadow, PA-C  norethindrone-ethinyl estradiol-iron (ESTROSTEP FE,TILIA FE,TRI-LEGEST FE) 1-20/1-30/1-35 MG-MCG tablet Take 1 tablet by mouth daily.    [provider]  topiramate (TOPAMAX) 100 MG tablet Take 100 mg by mouth 2 (two) times daily.    [provider]  valACYclovir (VALTREX) 1000 MG tablet Take 1 tablet (1,000 mg total) by mouth 3 (three) times daily. 11/09/17   Lattie Haw, MD  vortioxetine HBr (TRINTELLIX) 10 MG  TABS Take by mouth.    [provider]    Family History Family History  Problem Relation Age of Onset  . Thyroid disease Mother   . Hypertension Father   . Diabetes Maternal Grandmother   . Thyroid disease Sister     Social History Social History   Tobacco Use  . Smoking status: Never Smoker  . Smokeless tobacco: Never Used  Substance Use Topics  . Alcohol use: No  . Drug use: No     Allergies   Patient has no known allergies.   Review of Systems Review of Systems  Constitutional: Positive for chills, fatigue and fever. Negative for appetite change and unexpected weight change.  HENT: Negative for sore throat.   Eyes: Negative.   Respiratory: Negative.   Cardiovascular: Negative.   Gastrointestinal: Positive for diarrhea and nausea. Negative for abdominal pain, blood in stool and vomiting.  Genitourinary: Negative.   Musculoskeletal: Positive for back pain and myalgias. Negative for arthralgias.  Skin: Positive for rash.  Neurological: Negative for headaches.     Physical Exam Triage Vital Signs ED Triage Vitals  Enc Vitals Group     BP 11/09/17 1237 (!) 156/95     Pulse Rate 11/09/17 1237 100     Resp --      Temp 11/09/17 1237 98.7 F (37.1 C)     Temp Source 11/09/17 1237 Oral     SpO2 11/09/17 1237 99 %     Weight 11/09/17 1238 197 lb (89.4 kg)     Height 11/09/17 1238 5\' 3"  (1.6 m)     Head Circumference --      Peak Flow --      Pain Score 11/09/17 1237 6     Pain Loc --      Pain Edu? --      Excl. in GC? --    No data found.  Updated Vital Signs BP (!) 156/95 (BP Location: Right Arm)   Pulse 100   Temp 98.7 F (37.1 C) (Oral)   Ht 5\' 3"  (1.6 m)   Wt 197 lb (89.4 kg)   LMP 11/06/2017   SpO2 99%   BMI 34.90 kg/m   Visual Acuity Right Eye Distance:   Left Eye Distance:   Bilateral Distance:    Right Eye Near:   Left Eye Near:    Bilateral Near:     Physical Exam  Constitutional: She appears well-developed and  well-nourished. No distress.  HENT:  Head: Normocephalic.  Right Ear: External ear normal.  Left Ear: External ear normal.  Nose: Nose normal.  Mouth/Throat: Oropharynx is clear and moist.  Eyes: Pupils are equal, round, and reactive to light. Conjunctivae are normal.  Neck: Neck supple.  Cardiovascular: Normal rate, regular rhythm and normal heart sounds.  Pulmonary/Chest: Effort normal and breath sounds normal.  Abdominal: Soft. Bowel sounds are normal. She exhibits no distension and no mass. There is tenderness. There is no guarding.  Musculoskeletal: She exhibits no edema.  Lymphadenopathy:    She has no cervical adenopathy.  Neurological: She is alert.  Skin: Skin is warm and dry. Rash noted. Rash is maculopapular.     Herpetiform eruption right abdomen as noted on diagram.   Nursing note and vitals reviewed.    UC Treatments / Results  Labs (all labs ordered are listed, but only abnormal results are displayed) Labs Reviewed  GASTROINTESTINAL PATHOGEN PANEL PCR    EKG None Radiology No results found.  Procedures Procedures (including critical care time)  Medications Ordered in UC Medications - No data to display   Initial Impression / Assessment and Plan / UC Course  I have reviewed the triage vital signs and the nursing notes.  Pertinent labs & imaging results that were available during my care of the patient were reviewed by me and considered in my medical decision making (see chart for details).    Begin Valtrex. Patient's diarrhea probably represents recurrent IBS.  However, will obtain a GI pathogen panel (patient to return specimen). May take Ibuprofen 200mg , 4 tabs every 8 hours with food.  Begin clear liquids (Use electrolyte solution while having diarrhea) until improved, then advance to a SUPERVALU INCBRAT diet (Bananas, Rice, Applesauce, Toast).  Then gradually resume a regular diet when tolerated.  Avoid milk products until well.  To decrease diarrhea, mix one  teaspoon Citrucel (methylcellulose) in 2 oz water and drink one to three times daily.  Do not drink extra fluids with this dose and do not drink fluids for one hour afterwards.   May Zostrix Cream (capsaicin) to rash to decrease pain. Recommend Shingrix vaccine when well. Followup with Family Doctor if not improved in about one week.   Final Clinical Impressions(s) / UC Diagnoses   Final diagnoses:  Herpes zoster without complication  Diarrhea, unspecified type    ED Discharge Orders        Ordered    valACYclovir (VALTREX) 1000 MG tablet  3 times daily     11/09/17 1308          Lattie HawBeese, Marguita Venning A, MD 11/09/17 1340

## 2017-11-20 ENCOUNTER — Emergency Department
Admission: EM | Admit: 2017-11-20 | Discharge: 2017-11-20 | Disposition: A | Discharge status: Home / Self Care | Payer: BLUE CROSS/BLUE SHIELD | Source: Home / Self Care | Attending: Family Medicine | Admitting: Family Medicine

## 2017-11-20 ENCOUNTER — Other Ambulatory Visit: Payer: Self-pay

## 2017-11-20 DIAGNOSIS — B373 Candidiasis of vulva and vagina: Secondary | ICD-10-CM | POA: Diagnosis not present

## 2017-11-20 DIAGNOSIS — B3731 Acute candidiasis of vulva and vagina: Secondary | ICD-10-CM

## 2017-11-20 LAB — POCT WET + KOH PREP: Trich by wet prep: ABSENT

## 2017-11-20 MED ORDER — FLUCONAZOLE 150 MG PO TABS: 150.0000 mg | ORAL_TABLET | Freq: Once | ORAL | 0 refills | Status: AC

## 2017-11-20 NOTE — ED Triage Notes (Signed)
Pt c/o yeast infection symptoms. Was seen here in UC last week for shingles but was given valtrex, not an antibiotic. Pt states she has a hx of getting yeast infections.

## 2017-11-20 NOTE — ED Provider Notes (Signed)
[facility]    CSN: [id number] Arrival date & time: 11/20/17  1515     History   Chief Complaint Chief Complaint  Patient presents with  . Vaginitis    HPI Brittany Cunningham is a 32 y.o. female.   Patient awoke with mild swelling of her introitus, without pelvic or abdominal pain, vaginal discharge, or urinary symptoms.  No rash.  She is concerned that she may have a yeast infection, but denies recent antibiotic use.  She has a history of yeast infections and present symptoms feel similar.  No nausea/vomiting.  No fevers, chills, and sweats.  No concern for STD.  Patient's last menstrual period was 11/06/2017.    Vaginal Itching  This is a new problem. The current episode started 6 to 12 hours ago. The problem occurs constantly. The problem has not changed since onset.Pertinent negatives include no abdominal pain. Nothing aggravates the symptoms. Nothing relieves the symptoms. She has tried nothing for the symptoms.    Past Medical History:  Diagnosis Date  . Anemia    borderline previous pregnancy  . Complication of anesthesia    heartrate dropped to 30's with epidural  . Depression   . Fibromyalgia   . Headache(784.0)    migraines  . IBS (irritable bowel syndrome)   . Thyroid disease    currently not treated with meds    Patient Active Problem List   Diagnosis Date Noted  . Twin pregnancy w antenatal problem 05/09/2013  . PTSD, Depressed mood with anxious features. 06/07/2012  . Preventive measure 06/07/2012  . Headache(784.0) 06/07/2012    Past Surgical History:  Procedure Laterality Date  . CESAREAN SECTION N/A 05/09/2013   Procedure: CESAREAN SECTION/TWINS;  Surgeon: Lavina Hamman, MD;  Location: [facility];  Service: Obstetrics;  Laterality: N/A;  . NO PAST SURGERIES    . WISDOM TOOTH EXTRACTION      OB History    Gravida  2   Para  2   Term  1   Preterm  1   AB  0   Living  3     SAB  0   TAB  0   Ectopic  0   Multiple  1    Live Births  2            Home Medications    Prior to Admission medications   Medication Sig Start Date End Date Taking? Authorizing Provider  albuterol (PROVENTIL HFA;VENTOLIN HFA) 108 (90 Base) MCG/ACT inhaler Inhale 1-2 puffs into the lungs every 6 (six) hours as needed for wheezing or shortness of breath. 04/27/17   Lurene Shadow, PA-C  fluconazole (DIFLUCAN) 150 MG tablet Take 1 tablet (150 mg total) by mouth once for 1 dose. 11/20/17 11/20/17  Lattie Haw, MD  norethindrone-ethinyl estradiol-iron (ESTROSTEP FE,TILIA FE,TRI-LEGEST FE) 1-20/1-30/1-35 MG-MCG tablet Take 1 tablet by mouth daily.    [provider]  topiramate (TOPAMAX) 100 MG tablet Take 100 mg by mouth 2 (two) times daily.    [provider]  valACYclovir (VALTREX) 1000 MG tablet Take 1 tablet (1,000 mg total) by mouth 3 (three) times daily. 11/09/17   Lattie Haw, MD  vortioxetine HBr (TRINTELLIX) 10 MG TABS Take by mouth.    [provider]    Family History Family History  Problem Relation Age of Onset  . Thyroid disease Mother   . Hypertension Father   . Diabetes Maternal Grandmother   . Thyroid disease Sister  Social History Social History   Tobacco Use  . Smoking status: Never Smoker  . Smokeless tobacco: Never Used  Substance Use Topics  . Alcohol use: No  . Drug use: No     Allergies   Patient has no known allergies.   Review of Systems Review of Systems  Gastrointestinal: Negative for abdominal pain.  Genitourinary: Negative for dysuria, flank pain, frequency, genital sores, hematuria, menstrual problem, pelvic pain, urgency, vaginal bleeding, vaginal discharge and vaginal pain.  All other systems reviewed and are negative.    Physical Exam Triage Vital Signs ED Triage Vitals [11/20/17 1545]  Enc Vitals Group     BP (!) 130/93     Pulse Rate 99     Resp 18     Temp 97.6 F (36.4 C)     Temp Source Oral     SpO2 97 %     Weight 199 lb  (90.3 kg)     Height 5\' 3"  (1.6 m)     Head Circumference      Peak Flow      Pain Score 0     Pain Loc      Pain Edu?      Excl. in GC?    No data found.  Updated Vital Signs BP (!) 130/93 (BP Location: Right Arm)   Pulse 99   Temp 97.6 F (36.4 C) (Oral)   Resp 18   Ht 5\' 3"  (1.6 m)   Wt 199 lb (90.3 kg)   LMP 11/06/2017   SpO2 97%   BMI 35.25 kg/m   Visual Acuity Right Eye Distance:   Left Eye Distance:   Bilateral Distance:    Right Eye Near:   Left Eye Near:    Bilateral Near:     Physical Exam Nursing notes and Vital Signs reviewed. Appearance:  Patient appears stated age, and in no acute distress.    Eyes:  Pupils are equal, round, and reactive to light and accomodation.  Extraocular movement is intact.  Conjunctivae are not inflamed   Pharynx:  Normal; moist mucous membranes  Neck:  Supple.  No adenopathy Lungs:  Clear to auscultation.  Breath sounds are equal.  Moving air well. Heart:  Regular rate and rhythm without murmurs, rubs, or gallops.  Abdomen:  Nontender without masses or hepatosplenomegaly.  Bowel sounds are present.  No CVA or flank tenderness.  Extremities:  No edema.  Skin:  No rash present.    Pelvic exam deferred; instructed in obtaining self vaginal specimen. UC Treatments / Results  Labs (all labs ordered are listed, but only abnormal results are displayed) Labs Reviewed  POCT WET + KOH PREP:  +hyphae; negative clue cells, +epithelial cells; negative trich; negative WBC    EKG None Radiology No results found.  Procedures Procedures (including critical care time)  Medications Ordered in UC Medications - No data to display   Initial Impression / Assessment and Plan / UC Course  I have reviewed the triage vital signs and the nursing notes.  Pertinent labs & imaging results that were available during my care of the patient were reviewed by me and considered in my medical decision making (see chart for details).    Rx for  Diflucan 150, one PV Followup with Family Doctor if not improved in one week.   Final Clinical Impressions(s) / UC Diagnoses   Final diagnoses:  Candida vaginitis    ED Discharge Orders        Ordered  fluconazole (DIFLUCAN) 150 MG tablet   Once     11/20/17 1621         Lattie HawBeese, Olney Monier A, MD 11/22/17 1512

## 2018-05-04 ENCOUNTER — Emergency Department (INDEPENDENT_AMBULATORY_CARE_PROVIDER_SITE_OTHER)
Admission: EM | Admit: 2018-05-04 | Discharge: 2018-05-04 | Disposition: A | Discharge status: Home / Self Care | Payer: BLUE CROSS/BLUE SHIELD | Source: Home / Self Care | Attending: Family Medicine | Admitting: Family Medicine

## 2018-05-04 ENCOUNTER — Encounter: Payer: Self-pay | Admitting: *Deleted

## 2018-05-04 ENCOUNTER — Other Ambulatory Visit: Payer: Self-pay

## 2018-05-04 DIAGNOSIS — R109 Unspecified abdominal pain: Secondary | ICD-10-CM | POA: Diagnosis not present

## 2018-05-04 LAB — POCT URINALYSIS DIP (MANUAL ENTRY)
Bilirubin, UA: NEGATIVE
Glucose, UA: NEGATIVE mg/dL
Ketones, POC UA: NEGATIVE mg/dL
Leukocytes, UA: NEGATIVE
NITRITE UA: NEGATIVE
Protein Ur, POC: NEGATIVE mg/dL
SPEC GRAV UA: 1.025 (ref 1.010–1.025)
Urobilinogen, UA: 0.2 E.U./dL
pH, UA: 6 (ref 5.0–8.0)

## 2018-05-04 LAB — POCT CBC W AUTO DIFF (K'VILLE URGENT CARE)

## 2018-05-04 NOTE — Discharge Instructions (Addendum)
Increase fluid intake.  May take Zofran for nausea.  May take Ibuprofen 200mg , 4 tabs every 8 hours with food.  If symptoms become significantly worse during the night or over the weekend, proceed to the local emergency room.

## 2018-05-04 NOTE — ED Provider Notes (Signed)
[facility]    CSN: [id number] Arrival date & time: 05/04/18  1724     History   Chief Complaint Chief Complaint  Patient presents with  . Flank Pain    HPI Brittany Cunningham is a 32 y.o. female.   Patient reports that she did not feel well upon awakening today.  Later today she developed urinary frequency and right flank pain.  The pain radiates to her right abdomen.  She has also developed urinary urgency, dysuria, mild nausea, and chills.  No pelvic pain or vaginal discharge.  No LMP recorded. (Menstrual status: Oral contraceptives).  She has a family history of nephrolithiasis (sister).  The history is provided by the patient.  Flank Pain  This is a new problem. The current episode started 3 to 5 hours ago. The problem occurs constantly. The problem has not changed since onset.Associated symptoms include abdominal pain. Pertinent negatives include no chest pain, no headaches and no shortness of breath. Nothing aggravates the symptoms. Nothing relieves the symptoms. She has tried nothing for the symptoms.    Past Medical History:  Diagnosis Date  . Anemia    borderline previous pregnancy  . Complication of anesthesia    heartrate dropped to 30's with epidural  . Depression   . Fibromyalgia   . Headache(784.0)    migraines  . IBS (irritable bowel syndrome)   . Thyroid disease    currently not treated with meds    Patient Active Problem List   Diagnosis Date Noted  . Twin pregnancy w antenatal problem 05/09/2013  . PTSD, Depressed mood with anxious features. 06/07/2012  . Preventive measure 06/07/2012  . Headache(784.0) 06/07/2012    Past Surgical History:  Procedure Laterality Date  . CESAREAN SECTION N/A 05/09/2013   Procedure: CESAREAN SECTION/TWINS;  Surgeon: Lavina Hamman, MD;  Location: [facility];  Service: Obstetrics;  Laterality: N/A;  . NO PAST SURGERIES    . WISDOM TOOTH EXTRACTION      OB History    Gravida  2   Para  2   Term  1    Preterm  1   AB  0   Living  3     SAB  0   TAB  0   Ectopic  0   Multiple  1   Live Births  2            Home Medications    Prior to Admission medications   Medication Sig Start Date End Date Taking? Authorizing Provider  norethindrone-ethinyl estradiol-iron (ESTROSTEP FE,TILIA FE,TRI-LEGEST FE) 1-20/1-30/1-35 MG-MCG tablet Take 1 tablet by mouth daily.    [provider]    Family History Family History  Problem Relation Age of Onset  . Thyroid disease Mother   . Hypertension Father   . Diabetes Maternal Grandmother   . Thyroid disease & nephrolithiasis Sister      Social History Social History   Tobacco Use  . Smoking status: Never Smoker  . Smokeless tobacco: Never Used  Substance Use Topics  . Alcohol use: No  . Drug use: No     Allergies   Patient has no known allergies.   Review of Systems Review of Systems  Constitutional: Positive for activity change, appetite change and chills. Negative for diaphoresis, fatigue and fever.  HENT: Negative.   Eyes: Negative.   Respiratory: Negative for chest tightness and shortness of breath.   Cardiovascular: Negative for chest pain.  Gastrointestinal: Positive for abdominal pain and nausea.  Negative for vomiting.  Genitourinary: Positive for dysuria, flank pain, frequency and urgency. Negative for decreased urine volume, hematuria, pelvic pain, vaginal bleeding and vaginal discharge.  Musculoskeletal: Positive for back pain.  Skin: Negative.   Neurological: Negative for headaches.     Physical Exam Triage Vital Signs ED Triage Vitals  Enc Vitals Group     BP 05/04/18 1740 (!) 146/99     Pulse Rate 05/04/18 1740 89     Resp 05/04/18 1740 18     Temp 05/04/18 1740 97.9 F (36.6 C)     Temp Source 05/04/18 1740 Oral     SpO2 05/04/18 1740 98 %     Weight 05/04/18 1741 197 lb (89.4 kg)     Height 05/04/18 1741 5' 3.5" (1.613 m)     Head Circumference --      Peak Flow --       Pain Score 05/04/18 1740 9     Pain Loc --      Pain Edu? --      Excl. in GC? --    No data found.  Updated Vital Signs BP (!) 146/99 (BP Location: Right Arm)   Pulse 89   Temp 97.9 F (36.6 C) (Oral)   Resp 18   Ht 5' 3.5" (1.613 m)   Wt 89.4 kg   SpO2 98%   BMI 34.35 kg/m   Visual Acuity Right Eye Distance:   Left Eye Distance:   Bilateral Distance:    Right Eye Near:   Left Eye Near:    Bilateral Near:     Physical Exam  Constitutional: She appears well-developed and well-nourished. No distress.  HENT:  Head: Normocephalic.  Right Ear: External ear normal.  Left Ear: External ear normal.  Nose: Nose normal.  Mouth/Throat: Oropharynx is clear and moist.  Eyes: Pupils are equal, round, and reactive to light.  Neck: Normal range of motion.  Cardiovascular: Normal heart sounds.  Pulmonary/Chest: Breath sounds normal.  Abdominal: Soft. Normal appearance and bowel sounds are normal. There is no hepatosplenomegaly. There is tenderness in the right lower quadrant. There is no rigidity, no rebound, no guarding, no tenderness at McBurney's point and negative Murphy's sign. No hernia.    Musculoskeletal: She exhibits no edema.       Back:  Right flank tenderness to palpation is present.  Neurological: She is alert.  Skin: Skin is warm and dry. No rash noted.  Nursing note and vitals reviewed.    UC Treatments / Results  Labs (all labs ordered are listed, but only abnormal results are displayed) Labs Reviewed  POCT URINALYSIS DIP (MANUAL ENTRY) - Abnormal; Notable for the following components:      Result Value   Clarity, UA cloudy (*)    Blood, UA moderate (*)    All other components within normal limits  URINE CULTURE  POCT CBC W AUTO DIFF ([facility]):  WBC 8.8; LY 34.8; MO 1.9; GR 63.3; Hgb 12.8; Platelets 346     EKG None  Radiology No results found.  Procedures Procedures (including critical care time)  Medications Ordered in  UC Medications - No data to display  Initial Impression / Assessment and Plan / UC Course  I have reviewed the triage vital signs and the nursing notes.  Pertinent labs & imaging results that were available during my care of the patient were reviewed by me and considered in my medical decision making (see chart for details).    Normal WBC  reassuring. Suspect nephrolithiasis.  Patient's pain improved significantly while in clinic;  likely passed stone in clinic. If pain recurs tomorrow, return for CT renal scan. Urine culture pending. Final Clinical Impressions(s) / UC Diagnoses   Final diagnoses:  Right flank pain     Discharge Instructions     Increase fluid intake.  May take Zofran for nausea.  May take Ibuprofen 200mg , 4 tabs every 8 hours with food.  If symptoms become significantly worse during the night or over the weekend, proceed to the local emergency room.     ED Prescriptions    None        Lattie Haw, MD 05/07/18 [phone]

## 2018-05-04 NOTE — ED Triage Notes (Signed)
Pt c/o intermittent RT flank pain and urinary urgency x today. Denies fever. She took 3 tabs of Tylenol at 1700.

## 2018-05-06 ENCOUNTER — Telehealth: Payer: Self-pay

## 2018-05-06 LAB — URINE CULTURE
MICRO NUMBER: 91209241
SPECIMEN QUALITY:: ADEQUATE

## 2018-05-06 NOTE — Telephone Encounter (Signed)
Spoke with patient, feeling much better. 

## 2020-08-08 ENCOUNTER — Emergency Department (INDEPENDENT_AMBULATORY_CARE_PROVIDER_SITE_OTHER)
Admission: EM | Admit: 2020-08-08 | Discharge: 2020-08-08 | Disposition: A | Discharge status: Home / Self Care | Payer: BLUE CROSS/BLUE SHIELD | Source: Home / Self Care

## 2020-08-08 ENCOUNTER — Other Ambulatory Visit: Payer: Self-pay

## 2020-08-08 DIAGNOSIS — R350 Frequency of micturition: Secondary | ICD-10-CM

## 2020-08-08 DIAGNOSIS — N3000 Acute cystitis without hematuria: Secondary | ICD-10-CM

## 2020-08-08 HISTORY — DX: Polycystic ovarian syndrome: E28.2

## 2020-08-08 LAB — POCT URINALYSIS DIP (MANUAL ENTRY)
Glucose, UA: 250 mg/dL — AB
Nitrite, UA: POSITIVE — AB
Protein Ur, POC: 100 mg/dL — AB
Spec Grav, UA: 1.015 (ref 1.010–1.025)
Urobilinogen, UA: >=8 E.U./dL — AB
pH, UA: 5 (ref 5.0–8.0)

## 2020-08-08 MED ORDER — NITROFURANTOIN MONOHYD MACRO 100 MG PO CAPS: 100.0000 mg | ORAL_CAPSULE | Freq: Two times a day (BID) | ORAL | 0 refills | Status: AC

## 2020-08-08 NOTE — ED Provider Notes (Signed)
[facility]    CSN: [id number] Arrival date & time: 08/08/20  1849      History   Chief Complaint Chief Complaint  Patient presents with  . Urinary Frequency    HPI Brittany Cunningham is a 35 y.o. female.   HPI Patient presents today for evaluation of urinary frequency and urgency x2 days.  She also endorses some lower back pain and pelvic pressure.  She has been taking Pyridium and her symptoms over the last day without relief. Patient has a history of PCOS and obesity.  She is afebrile.  Negative of nausea or vomiting. Past Medical History:  Diagnosis Date  . Anemia    borderline previous pregnancy  . Complication of anesthesia    heartrate dropped to 30's with epidural  . Depression   . Fibromyalgia   . Headache(784.0)    migraines  . IBS (irritable bowel syndrome)   . PCOS (polycystic ovarian syndrome)   . Thyroid disease    currently not treated with meds    Patient Active Problem List   Diagnosis Date Noted  . Twin pregnancy w antenatal problem 05/09/2013  . PTSD, Depressed mood with anxious features. 06/07/2012  . Preventive measure 06/07/2012  . Headache(784.0) 06/07/2012    Past Surgical History:  Procedure Laterality Date  . CESAREAN SECTION N/A 05/09/2013   Procedure: CESAREAN SECTION/TWINS;  Surgeon: Lavina Hamman, MD;  Location: [facility];  Service: Obstetrics;  Laterality: N/A;  . NO PAST SURGERIES    . WISDOM TOOTH EXTRACTION      OB History    Gravida  2   Para  2   Term  1   Preterm  1   AB  0   Living  3     SAB  0   IAB  0   Ectopic  0   Multiple  1   Live Births  2            Home Medications    Prior to Admission medications   Medication Sig Start Date End Date Taking? Authorizing Provider  phenazopyridine (PYRIDIUM) 95 MG tablet Take 95 mg by mouth 3 (three) times daily as needed for pain.   Yes [provider]  sertraline (ZOLOFT) 100 MG tablet Take 100 mg by mouth daily.   Yes [provider]  spironolactone (ALDACTONE) 100 MG tablet Take 100 mg by mouth daily.   Yes [provider]  norethindrone-ethinyl estradiol-iron (ESTROSTEP FE,TILIA FE,TRI-LEGEST FE) 1-20/1-30/1-35 MG-MCG tablet Take 1 tablet by mouth daily.    [provider]    Family History Family History  Problem Relation Age of Onset  . Thyroid disease Mother   . Hypertension Father   . Diabetes Maternal Grandmother   . Thyroid disease Sister     Social History Social History   Tobacco Use  . Smoking status: Never Smoker  . Smokeless tobacco: Never Used  Vaping Use  . Vaping Use: Never used  Substance Use Topics  . Alcohol use: No  . Drug use: No     Allergies   Patient has no known allergies.   Review of Systems Review of Systems Pertinent negatives listed in HPI   Physical Exam Triage Vital Signs ED Triage Vitals  Enc Vitals Group     BP 08/08/20 1921 121/86     Pulse Rate 08/08/20 1921 86     Resp 08/08/20 1921 16     Temp 08/08/20 1921 98.2 F (36.8 C)  Temp Source 08/08/20 1921 Oral     SpO2 08/08/20 1921 96 %     Weight 08/08/20 1917 198 lb (89.8 kg)     Height 08/08/20 1917 5\' 3"  (1.6 m)     Head Circumference --      Peak Flow --      Pain Score 08/08/20 1917 6     Pain Loc --      Pain Edu? --      Excl. in GC? --    No data found.  Updated Vital Signs BP 121/86 (BP Location: Right Arm)   Pulse 86   Temp 98.2 F (36.8 C) (Oral)   Resp 16   Ht 5\' 3"  (1.6 m)   Wt 198 lb (89.8 kg)   LMP 08/06/2020 (Approximate)   SpO2 96%   BMI 35.07 kg/m   Visual Acuity Right Eye Distance:   Left Eye Distance:   Bilateral Distance:    Right Eye Near:   Left Eye Near:    Bilateral Near:     Physical Exam General appearance: alert, obese, cooperative and in no distress Head: Normocephalic, without obvious abnormality, atraumatic Respiratory: Respirations even and unlabored, normal respiratory rate Heart: rate and rhythm normal.    CVA:  no flank pain Extremities: No gross deformities Skin: Skin color, texture, turgor normal. No rashes seen  Psych: Appropriate mood and affect.  UC Treatments / Results  Labs (all labs ordered are listed, but only abnormal results are displayed) Labs Reviewed  POCT URINALYSIS DIP (MANUAL ENTRY)    EKG   Radiology No results found.  Procedures Procedures (including critical care time)  Medications Ordered in UC Medications - No data to display  Initial Impression / Assessment and Plan / UC Course  I have reviewed the triage vital signs and the nursing notes.  Pertinent labs & imaging results that were available during my care of the patient were reviewed by me and considered in my medical decision making (see chart for details).      UA abnormal, however patient took AZO making UA grossly abnormal. Will treat with a short course of Nitrofurantoin while urine culture pending. Encouraged increase intake of water. Recommended cranberry tablets, wiping from front to back, and urinating prior to and after cortis to reduce risk for reinfection. Urine culture pending. ER if symptoms become severe. Follow-up with PCP if symptoms do not completely resolve. Final Clinical Impressions(s) / UC Diagnoses   Final diagnoses:  Urinary frequency  Acute cystitis without hematuria     Discharge Instructions     Start Macrobid 100 mg twice daily for the next 5 days.  Urine culture is pending treating you today based on your symptoms which are consistent for that of a UTI.  If you develop any fever, nausea or vomiting and severe pain with urination or severe pelvic pain go immediately to the emergency department for further work-up and evaluation.    ED Prescriptions    Medication Sig Dispense Auth. Provider   nitrofurantoin, macrocrystal-monohydrate, (MACROBID) 100 MG capsule Take 1 capsule (100 mg total) by mouth 2 (two) times daily for 5 days. 10 capsule , FNP      PDMP not reviewed this encounter.   10/04/2020, FNP 08/11/20 2350

## 2020-08-08 NOTE — Discharge Instructions (Signed)
Start Macrobid 100 mg twice daily for the next 5 days.  Urine culture is pending treating you today based on your symptoms which are consistent for that of a UTI.  If you develop any fever, nausea or vomiting and severe pain with urination or severe pelvic pain go immediately to the emergency department for further work-up and evaluation.

## 2020-08-08 NOTE — ED Triage Notes (Signed)
Pt presents to Urgent Care with c/o urinary frequency and urgency since yesterday. Also states she is having pelvic and lower back pain.

## 2020-08-10 LAB — URINE CULTURE
MICRO NUMBER:: 11412536
Result:: NO GROWTH
SPECIMEN QUALITY:: ADEQUATE
